# Patient Record
Sex: Female | Born: 1950 | Race: Black or African American | Hispanic: No | Marital: Married | Smoking: Never smoker
Health system: Southern US, Community
[De-identification: ages and names within clinical notes are randomized; demographics above are authoritative.]

## PROBLEM LIST (undated history)

## (undated) DIAGNOSIS — J45909 Unspecified asthma, uncomplicated: Secondary | ICD-10-CM

## (undated) HISTORY — PX: CHOLECYSTECTOMY: SHX55

---

## 1998-12-12 ENCOUNTER — Emergency Department (HOSPITAL_COMMUNITY): Admission: EM | Admit: 1998-12-12 | Discharge: 1998-12-12 | Payer: Self-pay | Admitting: *Deleted

## 1998-12-12 ENCOUNTER — Encounter: Payer: Self-pay | Admitting: Emergency Medicine

## 1999-10-07 ENCOUNTER — Encounter: Payer: Self-pay | Admitting: Gynecology

## 1999-10-07 ENCOUNTER — Encounter: Admission: RE | Admit: 1999-10-07 | Discharge: 1999-10-07 | Payer: Self-pay | Admitting: Gynecology

## 1999-10-08 ENCOUNTER — Other Ambulatory Visit: Admission: RE | Admit: 1999-10-08 | Discharge: 1999-10-08 | Payer: Self-pay | Admitting: Gynecology

## 1999-10-12 ENCOUNTER — Encounter: Payer: Self-pay | Admitting: Gynecology

## 1999-10-12 ENCOUNTER — Encounter: Admission: RE | Admit: 1999-10-12 | Discharge: 1999-10-12 | Payer: Self-pay | Admitting: Gynecology

## 2000-04-23 ENCOUNTER — Emergency Department (HOSPITAL_COMMUNITY): Admission: EM | Admit: 2000-04-23 | Discharge: 2000-04-23 | Payer: Self-pay | Admitting: Emergency Medicine

## 2000-06-19 ENCOUNTER — Emergency Department (HOSPITAL_COMMUNITY): Admission: EM | Admit: 2000-06-19 | Discharge: 2000-06-19 | Payer: Self-pay | Admitting: Emergency Medicine

## 2000-10-12 ENCOUNTER — Other Ambulatory Visit: Admission: RE | Admit: 2000-10-12 | Discharge: 2000-10-12 | Payer: Self-pay | Admitting: Gynecology

## 2001-10-02 ENCOUNTER — Encounter: Admission: RE | Admit: 2001-10-02 | Discharge: 2001-10-02 | Payer: Self-pay | Admitting: Gynecology

## 2001-10-02 ENCOUNTER — Encounter: Payer: Self-pay | Admitting: Gynecology

## 2001-12-25 ENCOUNTER — Other Ambulatory Visit: Admission: RE | Admit: 2001-12-25 | Discharge: 2001-12-25 | Payer: Self-pay | Admitting: Gynecology

## 2004-10-07 ENCOUNTER — Other Ambulatory Visit: Admission: RE | Admit: 2004-10-07 | Discharge: 2004-10-07 | Payer: Self-pay | Admitting: Gynecology

## 2011-07-06 ENCOUNTER — Other Ambulatory Visit: Payer: Self-pay | Admitting: Internal Medicine

## 2011-07-10 ENCOUNTER — Ambulatory Visit (INDEPENDENT_AMBULATORY_CARE_PROVIDER_SITE_OTHER): Payer: PRIVATE HEALTH INSURANCE | Admitting: Family Medicine

## 2011-07-10 VITALS — BP 174/70 | HR 109 | Temp 97.8°F | Resp 20 | Ht 61.75 in | Wt 214.2 lb

## 2011-07-10 DIAGNOSIS — I10 Essential (primary) hypertension: Secondary | ICD-10-CM

## 2011-07-10 DIAGNOSIS — J45909 Unspecified asthma, uncomplicated: Secondary | ICD-10-CM

## 2011-07-10 MED ORDER — AMLODIPINE BESYLATE 10 MG PO TABS: 10.0000 mg | ORAL_TABLET | Freq: Every day | ORAL | Status: DC

## 2011-07-10 MED ORDER — FLUTICASONE-SALMETEROL 100-50 MCG/DOSE IN AEPB: 1.0000 | INHALATION_SPRAY | Freq: Two times a day (BID) | RESPIRATORY_TRACT | Status: DC

## 2011-07-10 NOTE — Progress Notes (Signed)
  Subjective:    Patient ID: Lindsay Bowen, female    DOB: [birth year], 61 y.o.   MRN: [id number]  HPI 61 yo female with HTN and asthma here for medication refills.    Review of Systems     Objective:   Physical Exam        Assessment & Plan:

## 2011-07-11 LAB — BASIC METABOLIC PANEL
BUN: 15 mg/dL (ref 6–23)
CO2: 27 mEq/L (ref 19–32)
Calcium: 9.9 mg/dL (ref 8.4–10.5)
Chloride: 104 mEq/L (ref 96–112)
Creat: 1.06 mg/dL (ref 0.50–1.10)
Glucose, Bld: 101 mg/dL — ABNORMAL HIGH (ref 70–99)
Potassium: 4.2 mEq/L (ref 3.5–5.3)
Sodium: 140 mEq/L (ref 135–145)

## 2011-07-14 ENCOUNTER — Encounter: Payer: Self-pay | Admitting: Radiology

## 2011-12-01 ENCOUNTER — Telehealth: Payer: Self-pay

## 2011-12-06 ENCOUNTER — Telehealth: Payer: Self-pay

## 2011-12-06 NOTE — Telephone Encounter (Signed)
Received approval of prior auth done for pts Advair diskus. Faxed notification to pharmacy and called pt to notify.

## 2011-12-26 NOTE — Telephone Encounter (Signed)
DONE

## 2012-03-06 ENCOUNTER — Other Ambulatory Visit: Payer: Self-pay | Admitting: Family Medicine

## 2012-04-03 ENCOUNTER — Other Ambulatory Visit: Payer: Self-pay | Admitting: Family Medicine

## 2012-05-11 ENCOUNTER — Other Ambulatory Visit: Payer: Self-pay | Admitting: Physician Assistant

## 2013-01-01 ENCOUNTER — Other Ambulatory Visit: Payer: Self-pay | Admitting: Family Medicine

## 2019-06-02 ENCOUNTER — Other Ambulatory Visit: Payer: Self-pay

## 2019-06-02 ENCOUNTER — Ambulatory Visit: Payer: Medicare Other | Attending: Internal Medicine

## 2019-06-02 DIAGNOSIS — Z23 Encounter for immunization: Secondary | ICD-10-CM

## 2019-06-02 NOTE — Progress Notes (Signed)
   Covid-19 Vaccination Clinic  Name:  CORDY GOLL    MRN: [id number] DOB: 12/11/1950  06/02/2019  Ms. Dorriety was observed post Covid-19 immunization for 15 minutes without incidence. She was provided with Vaccine Information Sheet and instruction to access the V-Safe system.   Ms. Braz was instructed to call 911 with any severe reactions post vaccine: Marland Kitchen Difficulty breathing  . Swelling of your face and throat  . A fast heartbeat  . A bad rash all over your body  . Dizziness and weakness    Immunizations Administered    Name Date Dose VIS Date Route   Pfizer COVID-19 Vaccine 06/02/2019  3:57 PM 0.3 mL 03/15/2019 Intramuscular   Manufacturer: [place]   Lot: [id number]   [place]: [id number]

## 2019-06-04 ENCOUNTER — Other Ambulatory Visit: Payer: Self-pay | Admitting: Family Medicine

## 2019-06-04 DIAGNOSIS — Z1231 Encounter for screening mammogram for malignant neoplasm of breast: Secondary | ICD-10-CM

## 2019-06-06 ENCOUNTER — Other Ambulatory Visit: Payer: Self-pay | Admitting: Family Medicine

## 2019-06-06 DIAGNOSIS — E2839 Other primary ovarian failure: Secondary | ICD-10-CM

## 2019-07-02 ENCOUNTER — Ambulatory Visit: Payer: Medicare Other | Attending: Internal Medicine

## 2019-07-02 DIAGNOSIS — Z23 Encounter for immunization: Secondary | ICD-10-CM

## 2019-07-02 NOTE — Progress Notes (Signed)
   Covid-19 Vaccination Clinic  Name:  Lindsay Bowen    MRN: [id number] DOB: [birth year]  07/02/2019  [name] was observed post Covid-19 immunization for 15 minutes without incident. She was provided with Vaccine Information Sheet and instruction to access the V-Safe system.   [name] was instructed to call 911 with any severe reactions post vaccine: Marland Kitchen Difficulty breathing  . Swelling of face and throat  . A fast heartbeat  . A bad rash all over body  . Dizziness and weakness   Immunizations Administered    Name Date Dose VIS Date Route   Pfizer COVID-19 Vaccine 07/02/2019  2:18 PM 0.3 mL 03/15/2019 Intramuscular   Manufacturer: [place]   Lot: [id number]   [place]: [id number]

## 2019-09-10 ENCOUNTER — Other Ambulatory Visit: Payer: Medicare Other

## 2019-09-10 ENCOUNTER — Ambulatory Visit: Payer: Medicare Other

## 2020-04-22 DIAGNOSIS — I10 Essential (primary) hypertension: Secondary | ICD-10-CM | POA: Diagnosis not present

## 2020-04-22 DIAGNOSIS — J45909 Unspecified asthma, uncomplicated: Secondary | ICD-10-CM | POA: Diagnosis not present

## 2020-04-22 DIAGNOSIS — E7800 Pure hypercholesterolemia, unspecified: Secondary | ICD-10-CM | POA: Diagnosis not present

## 2020-06-30 DIAGNOSIS — E7800 Pure hypercholesterolemia, unspecified: Secondary | ICD-10-CM | POA: Diagnosis not present

## 2020-06-30 DIAGNOSIS — J45909 Unspecified asthma, uncomplicated: Secondary | ICD-10-CM | POA: Diagnosis not present

## 2020-06-30 DIAGNOSIS — I10 Essential (primary) hypertension: Secondary | ICD-10-CM | POA: Diagnosis not present

## 2020-08-06 DIAGNOSIS — J45909 Unspecified asthma, uncomplicated: Secondary | ICD-10-CM | POA: Diagnosis not present

## 2020-08-06 DIAGNOSIS — I10 Essential (primary) hypertension: Secondary | ICD-10-CM | POA: Diagnosis not present

## 2020-08-06 DIAGNOSIS — E7800 Pure hypercholesterolemia, unspecified: Secondary | ICD-10-CM | POA: Diagnosis not present

## 2020-09-21 DIAGNOSIS — I10 Essential (primary) hypertension: Secondary | ICD-10-CM | POA: Diagnosis not present

## 2020-09-21 DIAGNOSIS — L918 Other hypertrophic disorders of the skin: Secondary | ICD-10-CM | POA: Diagnosis not present

## 2020-09-21 DIAGNOSIS — Z0000 Encounter for general adult medical examination without abnormal findings: Secondary | ICD-10-CM | POA: Diagnosis not present

## 2020-09-24 ENCOUNTER — Other Ambulatory Visit: Payer: Self-pay | Admitting: Family Medicine

## 2020-09-24 DIAGNOSIS — E2839 Other primary ovarian failure: Secondary | ICD-10-CM

## 2020-09-24 DIAGNOSIS — Z1231 Encounter for screening mammogram for malignant neoplasm of breast: Secondary | ICD-10-CM

## 2020-09-30 DIAGNOSIS — I10 Essential (primary) hypertension: Secondary | ICD-10-CM | POA: Diagnosis not present

## 2020-09-30 DIAGNOSIS — E7800 Pure hypercholesterolemia, unspecified: Secondary | ICD-10-CM | POA: Diagnosis not present

## 2020-09-30 DIAGNOSIS — J45909 Unspecified asthma, uncomplicated: Secondary | ICD-10-CM | POA: Diagnosis not present

## 2020-10-01 DIAGNOSIS — J45909 Unspecified asthma, uncomplicated: Secondary | ICD-10-CM | POA: Diagnosis not present

## 2020-10-07 DIAGNOSIS — I10 Essential (primary) hypertension: Secondary | ICD-10-CM | POA: Diagnosis not present

## 2020-10-23 DIAGNOSIS — I10 Essential (primary) hypertension: Secondary | ICD-10-CM | POA: Diagnosis not present

## 2020-10-23 DIAGNOSIS — J45909 Unspecified asthma, uncomplicated: Secondary | ICD-10-CM | POA: Diagnosis not present

## 2020-10-23 DIAGNOSIS — E7800 Pure hypercholesterolemia, unspecified: Secondary | ICD-10-CM | POA: Diagnosis not present

## 2020-10-31 DIAGNOSIS — J45909 Unspecified asthma, uncomplicated: Secondary | ICD-10-CM | POA: Diagnosis not present

## 2020-11-25 DIAGNOSIS — E7800 Pure hypercholesterolemia, unspecified: Secondary | ICD-10-CM | POA: Diagnosis not present

## 2020-11-25 DIAGNOSIS — J45909 Unspecified asthma, uncomplicated: Secondary | ICD-10-CM | POA: Diagnosis not present

## 2020-11-25 DIAGNOSIS — I10 Essential (primary) hypertension: Secondary | ICD-10-CM | POA: Diagnosis not present

## 2020-12-01 DIAGNOSIS — J45909 Unspecified asthma, uncomplicated: Secondary | ICD-10-CM | POA: Diagnosis not present

## 2020-12-02 DIAGNOSIS — R634 Abnormal weight loss: Secondary | ICD-10-CM | POA: Diagnosis not present

## 2020-12-11 ENCOUNTER — Inpatient Hospital Stay (HOSPITAL_COMMUNITY)
Admission: EM | Admit: 2020-12-11 | Discharge: 2020-12-15 | DRG: 683 | Disposition: A | Discharge status: Home / Self Care | Payer: Medicare Other | Attending: Internal Medicine | Admitting: Internal Medicine

## 2020-12-11 ENCOUNTER — Other Ambulatory Visit: Payer: Self-pay

## 2020-12-11 ENCOUNTER — Inpatient Hospital Stay (HOSPITAL_COMMUNITY): Payer: Medicare Other

## 2020-12-11 ENCOUNTER — Encounter (HOSPITAL_COMMUNITY): Payer: Self-pay

## 2020-12-11 DIAGNOSIS — Z1211 Encounter for screening for malignant neoplasm of colon: Secondary | ICD-10-CM | POA: Diagnosis not present

## 2020-12-11 DIAGNOSIS — R7989 Other specified abnormal findings of blood chemistry: Secondary | ICD-10-CM | POA: Diagnosis not present

## 2020-12-11 DIAGNOSIS — N179 Acute kidney failure, unspecified: Secondary | ICD-10-CM | POA: Diagnosis not present

## 2020-12-11 DIAGNOSIS — Z7951 Long term (current) use of inhaled steroids: Secondary | ICD-10-CM

## 2020-12-11 DIAGNOSIS — Z6828 Body mass index (BMI) 28.0-28.9, adult: Secondary | ICD-10-CM | POA: Diagnosis not present

## 2020-12-11 DIAGNOSIS — Z634 Disappearance and death of family member: Secondary | ICD-10-CM | POA: Diagnosis not present

## 2020-12-11 DIAGNOSIS — R17 Unspecified jaundice: Secondary | ICD-10-CM | POA: Diagnosis present

## 2020-12-11 DIAGNOSIS — E876 Hypokalemia: Secondary | ICD-10-CM | POA: Diagnosis not present

## 2020-12-11 DIAGNOSIS — R140 Abdominal distension (gaseous): Secondary | ICD-10-CM

## 2020-12-11 DIAGNOSIS — E782 Mixed hyperlipidemia: Secondary | ICD-10-CM | POA: Diagnosis present

## 2020-12-11 DIAGNOSIS — K5730 Diverticulosis of large intestine without perforation or abscess without bleeding: Secondary | ICD-10-CM | POA: Diagnosis not present

## 2020-12-11 DIAGNOSIS — J9811 Atelectasis: Secondary | ICD-10-CM | POA: Diagnosis not present

## 2020-12-11 DIAGNOSIS — F329 Major depressive disorder, single episode, unspecified: Principal | ICD-10-CM | POA: Diagnosis present

## 2020-12-11 DIAGNOSIS — Z79899 Other long term (current) drug therapy: Secondary | ICD-10-CM | POA: Diagnosis not present

## 2020-12-11 DIAGNOSIS — Z91048 Other nonmedicinal substance allergy status: Secondary | ICD-10-CM

## 2020-12-11 DIAGNOSIS — N289 Disorder of kidney and ureter, unspecified: Secondary | ICD-10-CM | POA: Diagnosis not present

## 2020-12-11 DIAGNOSIS — M47814 Spondylosis without myelopathy or radiculopathy, thoracic region: Secondary | ICD-10-CM | POA: Diagnosis not present

## 2020-12-11 DIAGNOSIS — J189 Pneumonia, unspecified organism: Secondary | ICD-10-CM

## 2020-12-11 DIAGNOSIS — I10 Essential (primary) hypertension: Secondary | ICD-10-CM | POA: Diagnosis present

## 2020-12-11 DIAGNOSIS — R634 Abnormal weight loss: Secondary | ICD-10-CM | POA: Diagnosis not present

## 2020-12-11 DIAGNOSIS — J4520 Mild intermittent asthma, uncomplicated: Secondary | ICD-10-CM | POA: Diagnosis present

## 2020-12-11 DIAGNOSIS — J45909 Unspecified asthma, uncomplicated: Secondary | ICD-10-CM | POA: Diagnosis not present

## 2020-12-11 DIAGNOSIS — R911 Solitary pulmonary nodule: Secondary | ICD-10-CM | POA: Diagnosis not present

## 2020-12-11 DIAGNOSIS — Z20822 Contact with and (suspected) exposure to covid-19: Secondary | ICD-10-CM | POA: Diagnosis present

## 2020-12-11 DIAGNOSIS — N19 Unspecified kidney failure: Secondary | ICD-10-CM

## 2020-12-11 DIAGNOSIS — R945 Abnormal results of liver function studies: Secondary | ICD-10-CM

## 2020-12-11 DIAGNOSIS — R9431 Abnormal electrocardiogram [ECG] [EKG]: Secondary | ICD-10-CM | POA: Diagnosis not present

## 2020-12-11 DIAGNOSIS — R7401 Elevation of levels of liver transaminase levels: Secondary | ICD-10-CM | POA: Diagnosis not present

## 2020-12-11 DIAGNOSIS — D72829 Elevated white blood cell count, unspecified: Secondary | ICD-10-CM | POA: Diagnosis not present

## 2020-12-11 HISTORY — DX: Unspecified asthma, uncomplicated: J45.909

## 2020-12-11 LAB — COMPREHENSIVE METABOLIC PANEL
ALT: 12 U/L (ref 0–44)
AST: 16 U/L (ref 15–41)
Albumin: 4.9 g/dL (ref 3.5–5.0)
Alkaline Phosphatase: 52 U/L (ref 38–126)
Anion gap: 15 (ref 5–15)
BUN: 57 mg/dL — ABNORMAL HIGH (ref 8–23)
CO2: 24 mmol/L (ref 22–32)
Calcium: 10 mg/dL (ref 8.9–10.3)
Chloride: 101 mmol/L (ref 98–111)
Creatinine, Ser: 4.65 mg/dL — ABNORMAL HIGH (ref 0.44–1.00)
GFR, Estimated: 10 mL/min — ABNORMAL LOW (ref >60)
Glucose, Bld: 118 mg/dL — ABNORMAL HIGH (ref 70–99)
Potassium: 3.3 mmol/L — ABNORMAL LOW (ref 3.5–5.1)
Sodium: 140 mmol/L (ref 135–145)
Total Bilirubin: 2.5 mg/dL — ABNORMAL HIGH (ref 0.3–1.2)
Total Protein: 8.3 g/dL — ABNORMAL HIGH (ref 6.5–8.1)

## 2020-12-11 LAB — CBC
HCT: 40.8 % (ref 36.0–46.0)
Hemoglobin: 13.9 g/dL (ref 12.0–15.0)
MCH: 29.2 pg (ref 26.0–34.0)
MCHC: 34.1 g/dL (ref 30.0–36.0)
MCV: 85.7 fL (ref 80.0–100.0)
Platelets: 276 10*3/uL (ref 150–400)
RBC: 4.76 MIL/uL (ref 3.87–5.11)
RDW: 13.7 % (ref 11.5–15.5)
WBC: 14.5 10*3/uL — ABNORMAL HIGH (ref 4.0–10.5)
nRBC: 0 % (ref 0.0–0.2)

## 2020-12-11 LAB — RESP PANEL BY RT-PCR (FLU A&B, COVID) ARPGX2
Influenza A by PCR: NEGATIVE
Influenza B by PCR: NEGATIVE
SARS Coronavirus 2 by RT PCR: NEGATIVE

## 2020-12-11 MED ORDER — LACTATED RINGERS IV SOLN: INTRAVENOUS | Status: DC

## 2020-12-11 NOTE — ED Triage Notes (Signed)
Pt came in with c/o abnormal labs. Pt states "it is something with my kidneys". States she had blood drawn at [facility] today and they called her with an abnormal result. She was not sent here, but she elected to come

## 2020-12-11 NOTE — ED Provider Notes (Signed)
[place] DEPT Provider Note   CSN: [id number] Arrival date & time: 12/11/20  1935     History Chief Complaint  Patient presents with   abnormal labs    ARLY BLEE is a 70 y.o. female.  70 year old female presents after visiting her doctor today was called this evening due to elevated creatinine.  States that she has no prior history of chronic kidney disease.  States that she has been in her baseline state of health.  No new medications.  Denies any shortness of breath.  Patient came to the hospital due to concern for this.      Past Medical History:  Diagnosis Date   Asthma     There are no problems to display for this patient.      OB History   No obstetric history on file.     No family history on file.  Social History   Tobacco Use   Smoking status: Never  Substance Use Topics   Alcohol use: No   Drug use: No    Home Medications Prior to Admission medications   Medication Sig Start Date End Date Taking? Authorizing Provider  amLODipine (NORVASC) 10 MG tablet Take 0.5 tablets (5 mg total) by mouth 2 (two) times daily. Need office visit for additional refills. 04/03/12   Harrison Mons, PA  Fluticasone-Salmeterol (ADVAIR DISKUS) 100-50 MCG/DOSE AEPB Inhale 1 puff into the lungs 2 (two) times daily. Need office visit for additional refills. 04/03/12   Harrison Mons, PA    Allergies    Red dye  Review of Systems   Review of Systems  All other systems reviewed and are negative.  Physical Exam Updated Vital Signs BP 111/68   Pulse 96   Temp 97.9 F (36.6 C) (Oral)   Resp 17   Wt 68.9 kg   SpO2 100%   BMI 28.03 kg/m   Physical Exam Vitals and nursing note reviewed.  Constitutional:      General: She is not in acute distress.    Appearance: Normal appearance. She is well-developed. She is not toxic-appearing.  HENT:     Head: Normocephalic and atraumatic.  Eyes:     General: Lids are normal.      Conjunctiva/sclera: Conjunctivae normal.     Pupils: Pupils are equal, round, and reactive to light.  Neck:     Thyroid: No thyroid mass.     Trachea: No tracheal deviation.  Cardiovascular:     Rate and Rhythm: Normal rate and regular rhythm.     Heart sounds: Normal heart sounds. No murmur heard.   No gallop.  Pulmonary:     Effort: Pulmonary effort is normal. No respiratory distress.     Breath sounds: Normal breath sounds. No stridor. No decreased breath sounds, wheezing, rhonchi or rales.  Abdominal:     General: There is no distension.     Palpations: Abdomen is soft.     Tenderness: There is no abdominal tenderness. There is no rebound.  Musculoskeletal:        General: No tenderness. Normal range of motion.     Cervical back: Normal range of motion and neck supple.  Skin:    General: Skin is warm and dry.     Findings: No abrasion or rash.  Neurological:     Mental Status: She is alert and oriented to person, place, and time. Mental status is at baseline.     GCS: GCS eye subscore is 4. GCS verbal  subscore is 5. GCS motor subscore is 6.     Cranial Nerves: Cranial nerves are intact. No cranial nerve deficit.     Sensory: No sensory deficit.     Motor: Motor function is intact.  Psychiatric:        Attention and Perception: Attention normal.        Speech: Speech normal.        Behavior: Behavior normal.    ED Results / Procedures / Treatments   Labs (all labs ordered are listed, but only abnormal results are displayed) Labs Reviewed  CBC - Abnormal; Notable for the following components:      Result Value   WBC 14.5 (*)    All other components within normal limits  COMPREHENSIVE METABOLIC PANEL - Abnormal; Notable for the following components:   Potassium 3.3 (*)    Glucose, Bld 118 (*)    BUN 57 (*)    Creatinine, Ser 4.65 (*)    Total Protein 8.3 (*)    Total Bilirubin 2.5 (*)    GFR, Estimated 10 (*)    All other components within normal limits  RESP PANEL  BY RT-PCR (FLU A&B, COVID) [id number]    EKG None  Radiology No results found.  Procedures Procedures   Medications Ordered in ED Medications  lactated ringers infusion ( Intravenous New Bag/Given 12/11/20 2234)    ED Course  I have reviewed the triage vital signs and the nursing notes.  Pertinent labs & imaging results that were available during my care of the patient were reviewed by me and considered in my medical decision making (see chart for details).    MDM Rules/Calculators/A&P                          Patient has a slightly low potassium here. Discussed with Dr. Carolin Sicks, nephrologist on-call, recommends hospitalist admission for evaluation of elevated creatinine Final Clinical Impression(s) / ED Diagnoses Final diagnoses:  None    Rx / DC Orders ED Discharge Orders     None        Lacretia Leigh, MD 12/11/20 2240

## 2020-12-11 NOTE — ED Provider Notes (Signed)
Emergency Medicine Provider Triage Evaluation Note  Lindsay Bowen , a 70 y.o. female  was evaluated in triage.  Pt complains of "abnormal labs."  Patient states she received a call earlier today regarding lab work that she had done at some point in the 1 to 2 weeks.  She believes she had blood work done at [facility] for regular screening labs.  She was called today with elevated kidney function.  She was not advised to come to the ED however was brought by family with concern.  Patient states she feels fine.  She has no complaints at this time.  She does seem like she does not know too much about her own medical history.  Family not currently in triage to gain additional information at this time.  Review of Systems  Positive: No complaints Negative: No complaints  Physical Exam  BP 129/64 (BP Location: Left Arm)   Pulse (!) 104   Temp 97.9 F (36.6 C) (Oral)   Resp 18   Wt 68.9 kg   SpO2 98%   BMI 28.03 kg/m  Gen:   Awake, no distress   Resp:  Normal effort  MSK:   Moves extremities without difficulty  Other:    Medical Decision Making  Medically screening exam initiated at 8:30 PM.  Appropriate orders placed.  Noah Delaine was informed that the remainder of the evaluation will be completed by another provider, this initial triage assessment does not replace that evaluation, and the importance of remaining in the ED until their evaluation is complete.     Eustaquio Maize, PA-C 12/11/20 2031    Lacretia Leigh, MD 12/15/20 1318

## 2020-12-12 ENCOUNTER — Encounter (HOSPITAL_COMMUNITY): Payer: Self-pay | Admitting: Internal Medicine

## 2020-12-12 ENCOUNTER — Inpatient Hospital Stay (HOSPITAL_COMMUNITY): Payer: Medicare Other

## 2020-12-12 DIAGNOSIS — F329 Major depressive disorder, single episode, unspecified: Secondary | ICD-10-CM | POA: Diagnosis present

## 2020-12-12 DIAGNOSIS — I10 Essential (primary) hypertension: Secondary | ICD-10-CM

## 2020-12-12 DIAGNOSIS — D72829 Elevated white blood cell count, unspecified: Secondary | ICD-10-CM | POA: Diagnosis present

## 2020-12-12 DIAGNOSIS — J4520 Mild intermittent asthma, uncomplicated: Secondary | ICD-10-CM

## 2020-12-12 DIAGNOSIS — N179 Acute kidney failure, unspecified: Principal | ICD-10-CM

## 2020-12-12 DIAGNOSIS — E876 Hypokalemia: Secondary | ICD-10-CM

## 2020-12-12 DIAGNOSIS — E782 Mixed hyperlipidemia: Secondary | ICD-10-CM

## 2020-12-12 LAB — HIV ANTIBODY (ROUTINE TESTING W REFLEX): HIV Screen 4th Generation wRfx: NONREACTIVE

## 2020-12-12 LAB — COMPREHENSIVE METABOLIC PANEL
ALT: 10 U/L (ref 0–44)
AST: 15 U/L (ref 15–41)
Albumin: 4.1 g/dL (ref 3.5–5.0)
Alkaline Phosphatase: 43 U/L (ref 38–126)
Anion gap: 13 (ref 5–15)
BUN: 54 mg/dL — ABNORMAL HIGH (ref 8–23)
CO2: 27 mmol/L (ref 22–32)
Calcium: 9.5 mg/dL (ref 8.9–10.3)
Chloride: 101 mmol/L (ref 98–111)
Creatinine, Ser: 4 mg/dL — ABNORMAL HIGH (ref 0.44–1.00)
GFR, Estimated: 12 mL/min — ABNORMAL LOW (ref >60)
Glucose, Bld: 78 mg/dL (ref 70–99)
Potassium: 3.4 mmol/L — ABNORMAL LOW (ref 3.5–5.1)
Sodium: 141 mmol/L (ref 135–145)
Total Bilirubin: 1.9 mg/dL — ABNORMAL HIGH (ref 0.3–1.2)
Total Protein: 7.1 g/dL (ref 6.5–8.1)

## 2020-12-12 LAB — CREATININE, URINE, RANDOM: Creatinine, Urine: 307.47 mg/dL

## 2020-12-12 LAB — CBC WITH DIFFERENTIAL/PLATELET
Abs Immature Granulocytes: 0.08 10*3/uL — ABNORMAL HIGH (ref 0.00–0.07)
Basophils Absolute: 0 10*3/uL (ref 0.0–0.1)
Basophils Relative: 0 %
Eosinophils Absolute: 0.1 10*3/uL (ref 0.0–0.5)
Eosinophils Relative: 0 %
HCT: 35.7 % — ABNORMAL LOW (ref 36.0–46.0)
Hemoglobin: 11.9 g/dL — ABNORMAL LOW (ref 12.0–15.0)
Immature Granulocytes: 1 %
Lymphocytes Relative: 17 %
Lymphs Abs: 2.3 10*3/uL (ref 0.7–4.0)
MCH: 28.9 pg (ref 26.0–34.0)
MCHC: 33.3 g/dL (ref 30.0–36.0)
MCV: 86.7 fL (ref 80.0–100.0)
Monocytes Absolute: 1 10*3/uL (ref 0.1–1.0)
Monocytes Relative: 7 %
Neutro Abs: 9.6 10*3/uL — ABNORMAL HIGH (ref 1.7–7.7)
Neutrophils Relative %: 75 %
Platelets: 215 10*3/uL (ref 150–400)
RBC: 4.12 MIL/uL (ref 3.87–5.11)
RDW: 13.6 % (ref 11.5–15.5)
WBC: 13 10*3/uL — ABNORMAL HIGH (ref 4.0–10.5)
nRBC: 0 % (ref 0.0–0.2)

## 2020-12-12 LAB — URINALYSIS, COMPLETE (UACMP) WITH MICROSCOPIC
Glucose, UA: NEGATIVE mg/dL
Hgb urine dipstick: NEGATIVE
Leukocytes,Ua: NEGATIVE
Nitrite: NEGATIVE
Protein, ur: NEGATIVE mg/dL
Specific Gravity, Urine: 1.015 (ref 1.005–1.030)
pH: 6 (ref 5.0–8.0)

## 2020-12-12 LAB — BASIC METABOLIC PANEL
Anion gap: 13 (ref 5–15)
BUN: 49 mg/dL — ABNORMAL HIGH (ref 8–23)
CO2: 23 mmol/L (ref 22–32)
Calcium: 9.6 mg/dL (ref 8.9–10.3)
Chloride: 107 mmol/L (ref 98–111)
Creatinine, Ser: 3.03 mg/dL — ABNORMAL HIGH (ref 0.44–1.00)
GFR, Estimated: 16 mL/min — ABNORMAL LOW (ref >60)
Glucose, Bld: 87 mg/dL (ref 70–99)
Potassium: 3.9 mmol/L (ref 3.5–5.1)
Sodium: 143 mmol/L (ref 135–145)

## 2020-12-12 LAB — PROCALCITONIN: Procalcitonin: 0.1 ng/mL

## 2020-12-12 LAB — C-REACTIVE PROTEIN: CRP: 0.5 mg/dL (ref <1.0)

## 2020-12-12 LAB — MAGNESIUM: Magnesium: 2.5 mg/dL — ABNORMAL HIGH (ref 1.7–2.4)

## 2020-12-12 LAB — SODIUM, URINE, RANDOM: Sodium, Ur: 21 mmol/L

## 2020-12-12 LAB — CK: Total CK: 88 U/L (ref 38–234)

## 2020-12-12 MED ORDER — ESCITALOPRAM OXALATE 10 MG PO TABS
10.0000 mg | ORAL_TABLET | Freq: Every day | ORAL | Status: DC
  Administered 2020-12-12 – 2020-12-15 (×4): 10 mg via ORAL
  Filled 2020-12-12 (×4): qty 1

## 2020-12-12 MED ORDER — FLUTICASONE FUROATE-VILANTEROL 100-25 MCG/INH IN AEPB
1.0000 | INHALATION_SPRAY | Freq: Every day | RESPIRATORY_TRACT | Status: DC
  Administered 2020-12-12 – 2020-12-15 (×4): 1 via RESPIRATORY_TRACT
  Filled 2020-12-12 (×2): qty 28

## 2020-12-12 MED ORDER — MELATONIN 5 MG PO TABS: 10.0000 mg | ORAL_TABLET | Freq: Every evening | ORAL | Status: DC | PRN

## 2020-12-12 MED ORDER — ATORVASTATIN CALCIUM 40 MG PO TABS
40.0000 mg | ORAL_TABLET | Freq: Every day | ORAL | Status: DC
  Administered 2020-12-12 – 2020-12-15 (×4): 40 mg via ORAL
  Filled 2020-12-12 (×4): qty 1

## 2020-12-12 MED ORDER — POTASSIUM CHLORIDE CRYS ER 20 MEQ PO TBCR
20.0000 meq | EXTENDED_RELEASE_TABLET | Freq: Once | ORAL | Status: AC
  Administered 2020-12-12: 20 meq via ORAL
  Filled 2020-12-12: qty 1

## 2020-12-12 MED ORDER — ACETAMINOPHEN 325 MG PO TABS: 650.0000 mg | ORAL_TABLET | Freq: Four times a day (QID) | ORAL | Status: DC | PRN

## 2020-12-12 MED ORDER — AMLODIPINE BESYLATE 5 MG PO TABS
5.0000 mg | ORAL_TABLET | Freq: Two times a day (BID) | ORAL | Status: DC
  Administered 2020-12-12 – 2020-12-14 (×4): 5 mg via ORAL
  Filled 2020-12-12 (×5): qty 1

## 2020-12-12 MED ORDER — ONDANSETRON HCL 4 MG PO TABS: 4.0000 mg | ORAL_TABLET | Freq: Four times a day (QID) | ORAL | Status: DC | PRN

## 2020-12-12 MED ORDER — HEPARIN SODIUM (PORCINE) 5000 UNIT/ML IJ SOLN
5000.0000 [IU] | Freq: Three times a day (TID) | INTRAMUSCULAR | Status: DC
  Administered 2020-12-12 – 2020-12-15 (×10): 5000 [IU] via SUBCUTANEOUS
  Filled 2020-12-12 (×10): qty 1

## 2020-12-12 MED ORDER — ONDANSETRON HCL 4 MG/2ML IJ SOLN
4.0000 mg | Freq: Four times a day (QID) | INTRAMUSCULAR | Status: DC | PRN
  Administered 2020-12-13: 4 mg via INTRAVENOUS
  Filled 2020-12-12: qty 2

## 2020-12-12 MED ORDER — POLYETHYLENE GLYCOL 3350 17 G PO PACK: 17.0000 g | PACK | Freq: Every day | ORAL | Status: DC | PRN

## 2020-12-12 MED ORDER — LACTATED RINGERS IV SOLN: INTRAVENOUS | Status: AC

## 2020-12-12 MED ORDER — ACETAMINOPHEN 650 MG RE SUPP: 650.0000 mg | Freq: Four times a day (QID) | RECTAL | Status: DC | PRN

## 2020-12-12 MED ORDER — HYDRALAZINE HCL 20 MG/ML IJ SOLN: 10.0000 mg | Freq: Four times a day (QID) | INTRAMUSCULAR | Status: DC | PRN

## 2020-12-12 NOTE — ED Notes (Signed)
Patient ambulatory to restroom with standby assistance.

## 2020-12-12 NOTE — Progress Notes (Signed)
Patient admitted this morning by my partner with new onset AKI of unclear etiology though likely secondary to decreased p.o. intake losartan and diuresis at home. Creatinine improving with IV hydration. Renal ultrasound no acute findings no hydronephrosis.

## 2020-12-12 NOTE — H&P (Signed)
History and Physical    Lindsay Bowen [id number] DOB: [birth year] DOA: 12/11/2020  PCP: Kathyrn Lass, MD  Patient coming from: Home   Chief Complaint:  Chief Complaint  Patient presents with   abnormal labs     HPI:    70 year old female with past medical history of asthma, hyperlipidemia, hypertension who presents to [facility] emergency department after being instructed by her primary care provider that she has "abnormal labs."    Patient explains that she had blood work with [facility] within the past 1 to 2 weeks.  She states that these were routine labs and that she has not experienced any new symptoms as of late.  Patient denies shortness of breath, fevers, abdominal pain, dysuria, diarrhea, changes in appetite, recent travel, sick contacts or contact with confirmed COVID-19 infection.  Patient denies regular NSAID use or illicit drug use.  Upon further questioning however patient does admit to a 1 year history of severe unrelenting depression since the death of her husband last 01-01-2023.  This is led to extremely poor oral intake as the patient eats and drinks very little.  Patient reports at least a 40 pound weight loss over the span of time as a result.  Patient was contacted by her primary care provider office and instructed to come in for evaluation.  Patient then presented to [facility] emergency department for evaluation.  Upon evaluation in the emergency department, chemistry did confirm the patient has a markedly elevated creatinine of 4.65 with elevated BUN of 57.  Patient was initiated on intravenous hydration.  Case was discussed with Dr. Carolin Sicks with nephrology who recommended hospitalization for work-up of patient's surprisingly substantial renal injury.  The hospitalist group was then called to assess the patient for admission to the hospital.  Review of Systems:   Review of Systems  Constitutional:  Positive for malaise/fatigue and  weight loss.  Psychiatric/Behavioral:  Positive for depression.   All other systems reviewed and are negative.  Past Medical History:  Diagnosis Date   Asthma     Past Surgical History:  Procedure Laterality Date   CHOLECYSTECTOMY       reports that she has never smoked. She has never used smokeless tobacco. She reports that she does not drink alcohol and does not use drugs.  Allergies  Allergen Reactions   Red Dye Itching    Family History  Problem Relation Age of Onset   Kidney disease Neg Hx      Prior to Admission medications   Medication Sig Start Date End Date Taking? Authorizing Provider  amLODipine (NORVASC) 10 MG tablet Take 0.5 tablets (5 mg total) by mouth 2 (two) times daily. Need office visit for additional refills. 04/03/12  Yes Jeffery, Chelle, PA  atorvastatin (LIPITOR) 40 MG tablet Take 40 mg by mouth daily. 12/09/20  Yes [provider]  Fluticasone-Salmeterol (ADVAIR DISKUS) 100-50 MCG/DOSE AEPB Inhale 1 puff into the lungs 2 (two) times daily. Need office visit for additional refills. 04/03/12  Yes Jeffery, Chelle, PA  hydrochlorothiazide (HYDRODIURIL) 25 MG tablet Take 25 mg by mouth every morning. 09/30/20  Yes [provider]  losartan (COZAAR) 100 MG tablet Take 100 mg by mouth daily. 09/30/20  Yes [provider]    Physical Exam: Vitals:   12/12/20 0200 12/12/20 0401 12/12/20 0630 12/12/20 0700  BP: (!) 112/59 103/63 120/65 120/66  Pulse: 78 78 85 84  Resp:  16  16  Temp:  TempSrc:      SpO2: 96% 96% 97% 97%  Weight:        Constitutional: Awake alert and oriented x3, no associated distress.   Skin: no rashes, no lesions, poor skin turgor noted. Eyes: Pupils are equally reactive to light.  No evidence of scleral icterus or conjunctival pallor.  ENMT: Dry mucous membranes noted.  Posterior pharynx clear of any exudate or lesions.   Neck: normal, supple, no masses, no thyromegaly.  No evidence of jugular venous  distension.   Respiratory: clear to auscultation bilaterally, no wheezing, no crackles. Normal respiratory effort. No accessory muscle use.  Cardiovascular: Regular rate and rhythm, no murmurs / rubs / gallops. No extremity edema. 2+ pedal pulses. No carotid bruits.  Chest:   Nontender without crepitus or deformity.   Back:   Nontender without crepitus or deformity. Abdomen: Vague firm movable intra-abdominal fullness palpated in the epigastric and left upper quadrant areas.  Abdomen is nontender and soft.    Positive bowel sounds noted in all quadrants.   Musculoskeletal: No joint deformity upper and lower extremities. Good ROM, no contractures. Normal muscle tone.  Neurologic: CN 2-12 grossly intact. Sensation intact.  Patient moving all 4 extremities spontaneously.  Patient is following all commands.  Patient is responsive to verbal stimuli.   Psychiatric: Patient exhibits extremely depressed mood with flat affect.  Patient denies suicidal ideation.  Patient seems to possess insight as to their current situation.     Labs on Admission: I have personally reviewed following labs and imaging studies -   CBC: Recent Labs  Lab 12/11/20 2024 12/12/20 0437  WBC 14.5* 13.0*  NEUTROABS  --  9.6*  HGB 13.9 11.9*  HCT 40.8 35.7*  MCV 85.7 86.7  PLT 276 [id number]   Basic Metabolic Panel: Recent Labs  Lab 12/11/20 2024 12/12/20 0437  NA 140 141  K 3.3* 3.4*  CL 101 101  CO2 24 27  GLUCOSE 118* 78  BUN 57* 54*  CREATININE 4.65* 4.00*  CALCIUM 10.0 9.5  MG  --  2.5*   GFR: CrCl cannot be calculated (Unknown ideal weight.). Liver Function Tests: Recent Labs  Lab 12/11/20 2024 12/12/20 0437  AST 16 15  ALT 12 10  ALKPHOS 52 43  BILITOT 2.5* 1.9*  PROT 8.3* 7.1  ALBUMIN 4.9 4.1   No results for input(s): LIPASE, AMYLASE in the last 168 hours. No results for input(s): AMMONIA in the last 168 hours. Coagulation Profile: No results for input(s): INR, PROTIME in the last 168  hours. Cardiac Enzymes: Recent Labs  Lab 12/12/20 0437  CKTOTAL 88   BNP (last 3 results) No results for input(s): PROBNP in the last 8760 hours. HbA1C: No results for input(s): HGBA1C in the last 72 hours. CBG: No results for input(s): GLUCAP in the last 168 hours. Lipid Profile: No results for input(s): CHOL, HDL, LDLCALC, TRIG, CHOLHDL, LDLDIRECT in the last 72 hours. Thyroid Function Tests: No results for input(s): TSH, T4TOTAL, FREET4, T3FREE, THYROIDAB in the last 72 hours. Anemia Panel: No results for input(s): VITAMINB12, FOLATE, FERRITIN, TIBC, IRON, RETICCTPCT in the last 72 hours. Urine analysis:    Component Value Date/Time   COLORURINE YELLOW (A) 12/12/2020 0026   APPEARANCEUR HAZY (A) 12/12/2020 0026   LABSPEC 1.015 12/12/2020 0026   PHURINE 6.0 12/12/2020 0026   GLUCOSEU NEGATIVE 12/12/2020 0026   HGBUR NEGATIVE 12/12/2020 0026   BILIRUBINUR SMALL (A) 12/12/2020 0026   KETONESUR TRACE (A) 12/12/2020 0026   PROTEINUR NEGATIVE  12/12/2020 0026   NITRITE NEGATIVE 12/12/2020 0026   LEUKOCYTESUR NEGATIVE 12/12/2020 0026    Radiological Exams on Admission - Personally Reviewed: DG Chest 1 View  Result Date: 12/12/2020 CLINICAL DATA:  History of asthma. EXAM: CHEST  1 VIEW COMPARISON:  None. FINDINGS: Mild diffuse chronic interstitial coarsening. No focal consolidation, pleural effusion, or pneumothorax. The cardiac silhouette is within normal limits. Acute osseous pathology. Degenerative changes of the spine. IMPRESSION: No active disease. Electronically Signed   By: Anner Crete M.D.   On: 12/12/2020 03:02   US RENAL  Result Date: 12/11/2020 CLINICAL DATA:  Renal failure EXAM: RENAL / URINARY TRACT ULTRASOUND COMPLETE COMPARISON:  None. FINDINGS: Right Kidney: Renal measurements: 9.6 x 4.4 x 4.2 cm = volume: 92.4 mL. Cortex is slightly echogenic. No mass or hydronephrosis. Left Kidney: Renal measurements: 9.6 x 4.7 x 5 cm = volume: 116.8 mL. Cortex is slightly  echogenic. No mass or hydronephrosis. Bladder: Appears normal for degree of bladder distention. Other: None. IMPRESSION: Slightly echogenic kidneys consistent with medical renal disease. No hydronephrosis Electronically Signed   By: Donavan Foil M.D.   On: 12/11/2020 23:43   US Abdomen Limited RUQ (LIVER/GB)  Result Date: 12/12/2020 CLINICAL DATA:  Elevated liver function tests EXAM: ULTRASOUND ABDOMEN LIMITED RIGHT UPPER QUADRANT COMPARISON:  None. FINDINGS: Gallbladder: Absent Common bile duct: Diameter: 6 mm in proximal diameter Liver: No focal lesion identified. Within normal limits in parenchymal echogenicity. Portal vein is patent on color Doppler imaging with normal direction of blood flow towards the liver. Other: None. IMPRESSION: Status post cholecystectomy. Normal examination of the liver. Electronically Signed   By: Fidela Salisbury M.D.   On: 12/12/2020 02:37      Assessment/Plan Principal Problem:   Acute kidney injury [facility])  Patient presenting with substantial acute kidney injury and has been directed to come to the [facility] emergency department by her primary care provider Etiology is currently unclear although this could possibly be prerenal in origin due to volume depletion due to longstanding extremely poor oral intake in the setting of depression Hydrating patient with intravenous isotonic fluids Obtaining urine electrolytes Obtaining renal ultrasound Obtaining bladder scan Strict input and output monitoring Monitoring renal function and electrolytes with serial chemistries Minimizing nephrotoxic agents as much as possible.  Active Problems:   Essential hypertension  Continue home regimen of amlodipine Holding home regimen of losartan and hydrochlorothiazide As needed intravenous hydralazine for markedly elevated blood pressure.    Mixed hyperlipidemia  Continue home regimen of Lipitor    Mild intermittent asthma, uncomplicated  No evidence of asthma  exacerbation at this time    Hypokalemia  Cautious replacement with small amounts of potassium considering acute kidney injury    Hyperbilirubinemia  Mild hyperbilirubinemia noted on initial chemistries Considering vague presentation and significant unintentional weight loss obtaining right upper quadrant ultrasound    Leukocytosis  No clinical evidence of infection Obtain urinalysis and chest x-ray No recent steroid use Patient does not appear to be in significant distress and therefore I do not believe that this is stress-induced. If persists patient may need outpatient hematology follow-up    Major depressive disorder  Persisting substantial depression nearly 1 year after the death of her husband in Jan 06, 2020 This is leading to extremely poor oral intake depressed mood and decreased energy Will initiate Lexapro Patient should follow-up with behavioral health at time of discharge.   Code Status:  Full code  code status decision has been confirmed with: patient Family Communication:  deferred   Status is: Inpatient  Remains inpatient appropriate because:Ongoing diagnostic testing needed not appropriate for outpatient work up, IV treatments appropriate due to intensity of illness or inability to take PO, and Inpatient level of care appropriate due to severity of illness  Dispo: The patient is from: Home              Anticipated d/c is to: Home              Patient currently is not medically stable to d/c.   Difficult to place patient No        Vernelle Emerald MD [facility] Pager [phone]  If 7PM-7AM, please contact night-coverage [email or web address] Use universal [place] password for that web site. If you do not have the password, please call the hospital operator.  12/12/2020, 9:16 AM

## 2020-12-13 LAB — CBC
HCT: 35.8 % — ABNORMAL LOW (ref 36.0–46.0)
Hemoglobin: 12.3 g/dL (ref 12.0–15.0)
MCH: 29.1 pg (ref 26.0–34.0)
MCHC: 34.4 g/dL (ref 30.0–36.0)
MCV: 84.8 fL (ref 80.0–100.0)
Platelets: 230 10*3/uL (ref 150–400)
RBC: 4.22 MIL/uL (ref 3.87–5.11)
RDW: 13.5 % (ref 11.5–15.5)
WBC: 10.7 10*3/uL — ABNORMAL HIGH (ref 4.0–10.5)
nRBC: 0 % (ref 0.0–0.2)

## 2020-12-13 LAB — BASIC METABOLIC PANEL
Anion gap: 11 (ref 5–15)
BUN: 42 mg/dL — ABNORMAL HIGH (ref 8–23)
CO2: 25 mmol/L (ref 22–32)
Calcium: 9.7 mg/dL (ref 8.9–10.3)
Chloride: 108 mmol/L (ref 98–111)
Creatinine, Ser: 2.54 mg/dL — ABNORMAL HIGH (ref 0.44–1.00)
GFR, Estimated: 20 mL/min — ABNORMAL LOW (ref >60)
Glucose, Bld: 77 mg/dL (ref 70–99)
Potassium: 3.9 mmol/L (ref 3.5–5.1)
Sodium: 144 mmol/L (ref 135–145)

## 2020-12-13 LAB — MAGNESIUM: Magnesium: 2.4 mg/dL (ref 1.7–2.4)

## 2020-12-13 LAB — UREA NITROGEN, URINE: Urea Nitrogen, Ur: 556 mg/dL

## 2020-12-13 MED ORDER — SODIUM CHLORIDE 0.9 % IV SOLN: INTRAVENOUS | Status: DC

## 2020-12-13 NOTE — Progress Notes (Addendum)
PROGRESS NOTE    Lindsay Bowen  [id number] DOB: 11-26-1950 DOA: 12/11/2020 PCP: Kathyrn Lass, MD  Brief Narrative: 70 year old female with past medical history of asthma, hyperlipidemia, hypertension who presents to [facility] emergency department after being instructed by her primary care provider that she has "abnormal labs."     Patient explains that she had blood work with [facility] within the past 1 to 2 weeks.  She states that these were routine labs and that she has not experienced any new symptoms as of late.  Patient denies shortness of breath, fevers, abdominal pain, dysuria, diarrhea, changes in appetite, recent travel, sick contacts or contact with confirmed COVID-19 infection.  Patient denies regular NSAID use or illicit drug use.   Upon further questioning however patient does admit to a 1 year history of severe unrelenting depression since the death of her husband last 2022-12-30.  This is led to extremely poor oral intake as the patient eats and drinks very little.  Patient reports at least a 40 pound weight loss over the span of time as a result.   Patient was contacted by her primary care provider office and instructed to come in for evaluation.  Patient then presented to [facility] emergency department for evaluation.   Upon evaluation in the emergency department, chemistry did confirm the patient has a markedly elevated creatinine of 4.65 with elevated BUN of 57.  Patient was initiated on intravenous hydration.  Case was discussed with Dr. Carolin Sicks with nephrology who recommended hospitalization for work-up of patient's surprisingly substantial renal injury.  The hospitalist group was then called to assess the patient for admission to the hospital.  Assessment & Plan:   Principal Problem:   Acute kidney injury [facility]) Active Problems:   Essential hypertension   Mixed hyperlipidemia   Mild intermittent asthma, uncomplicated   Hypokalemia    Hyperbilirubinemia   Leukocytosis   Major depressive disorder   #1 AKI secondary to multifactorial reasons Lasix ACE inhibitor decreased p.o. intake.  Renal functions improving on IV fluids since admission.  Renal ultrasound shows no hydronephrosis or stones. Creatinine 3.03 from 4.65 on admission. Continue NS Continue to hold HCTZ and Cozaar. Work-up includes CT of the abdomen and pelvis no acute process, 4 mm right lower lobe lung nodule.  Left ovarian prominence for age is mild. Pelvic ultrasound shows normal appearance of the right ovary and 3.7 cm complicated left adnexal cyst.  Follow-up ultrasound in 3 months.  #2 history of essential hypertension stable on Norvasc.  #3 history of hyperlipidemia continue statin  #4 history of asthma stable  #5 depression her husband died in 12/30/2019 She was started on Lexapro this admission.  #6 mildly elevated total bilirubin decreasing.   Estimated body mass index is 28.03 kg/m as calculated from the following:   Height as of 07/10/11: 5' 1.75" (1.568 m).   Weight as of this encounter: 68.9 kg.  DVT prophylaxis: Heparin Code Status: Full code Family Communication: None at bedside Disposition Plan:  Status is: Inpatient  Remains inpatient appropriate because:Persistent severe electrolyte disturbances  Dispo: The patient is from: Home              Anticipated d/c is to: Home              Patient currently is not medically stable to d/c.   Difficult to place patient No    Consultants: None  Procedures: None Antimicrobials: None  Subjective: Patient is resting in bed she  complains of some nausea and threw up her Ensure today denies any abdominal pain  Objective: Vitals:   12/12/20 1707 12/12/20 2035 12/13/20 0019 12/13/20 0444  BP: 139/66 125/62 132/68 122/61  Pulse: 91 85 83 79  Resp: '16 18 18 16  '$ Temp: 98 F (36.7 C) 98.4 F (36.9 C) 98.6 F (37 C) 98.5 F (36.9 C)  TempSrc: Oral Oral Oral Oral  SpO2: 99% 98%  98% 98%  Weight:        Intake/Output Summary (Last 24 hours) at 12/13/2020 1041 Last data filed at 12/13/2020 0700 Gross per 24 hour  Intake 357 ml  Output 120 ml  Net 237 ml   Filed Weights   12/11/20 2008  Weight: 68.9 kg    Examination:  General exam: Appears calm and comfortable  Respiratory system: Clear to auscultation. Respiratory effort normal. Cardiovascular system: S1 & S2 heard, RRR. No JVD, murmurs, rubs, gallops or clicks. No pedal edema. Gastrointestinal system: Abdomen is nondistended, soft and nontender. No organomegaly or masses felt. Normal bowel sounds heard. Central nervous system: Alert and oriented. No focal neurological deficits. Extremities: Symmetric 5 x 5 power. Skin: No rashes, lesions or ulcers Psychiatry: Judgement and insight appear normal. Mood & affect appropriate.     Data Reviewed: I have personally reviewed following labs and imaging studies  CBC: Recent Labs  Lab 12/11/20 2024 12/12/20 0437 12/13/20 0550  WBC 14.5* 13.0* 10.7*  NEUTROABS  --  9.6*  --   HGB 13.9 11.9* 12.3  HCT 40.8 35.7* 35.8*  MCV 85.7 86.7 84.8  PLT 276 215 [id number]   Basic Metabolic Panel: Recent Labs  Lab 12/11/20 2024 12/12/20 0437 12/12/20 1701  NA 140 141 143  K 3.3* 3.4* 3.9  CL 101 101 107  CO2 '24 27 23  '$ GLUCOSE 118* 78 87  BUN 57* 54* 49*  CREATININE 4.65* 4.00* 3.03*  CALCIUM 10.0 9.5 9.6  MG  --  2.5*  --    GFR: CrCl cannot be calculated (Unknown ideal weight.). Liver Function Tests: Recent Labs  Lab 12/11/20 2024 12/12/20 0437  AST 16 15  ALT 12 10  ALKPHOS 52 43  BILITOT 2.5* 1.9*  PROT 8.3* 7.1  ALBUMIN 4.9 4.1   No results for input(s): LIPASE, AMYLASE in the last 168 hours. No results for input(s): AMMONIA in the last 168 hours. Coagulation Profile: No results for input(s): INR, PROTIME in the last 168 hours. Cardiac Enzymes: Recent Labs  Lab 12/12/20 0437  CKTOTAL 88   BNP (last 3 results) No results for input(s):  PROBNP in the last 8760 hours. HbA1C: No results for input(s): HGBA1C in the last 72 hours. CBG: No results for input(s): GLUCAP in the last 168 hours. Lipid Profile: No results for input(s): CHOL, HDL, LDLCALC, TRIG, CHOLHDL, LDLDIRECT in the last 72 hours. Thyroid Function Tests: No results for input(s): TSH, T4TOTAL, FREET4, T3FREE, THYROIDAB in the last 72 hours. Anemia Panel: No results for input(s): VITAMINB12, FOLATE, FERRITIN, TIBC, IRON, RETICCTPCT in the last 72 hours. Sepsis Labs: Recent Labs  Lab 12/12/20 0437  PROCALCITON 0.10    Recent Results (from the past 240 hour(s))  Resp Panel by RT-PCR (Flu A&B, Covid) Nasopharyngeal Swab     Status: None   Collection Time: 12/11/20 10:27 PM   Specimen: Nasopharyngeal Swab; Nasopharyngeal(NP) swabs in vial transport medium  Result Value Ref Range Status   SARS Coronavirus 2 by RT PCR NEGATIVE NEGATIVE Final    Comment: (NOTE) SARS-CoV-2 target nucleic  acids are NOT DETECTED.  The SARS-CoV-2 RNA is generally detectable in upper respiratory specimens during the acute phase of infection. The lowest concentration of SARS-CoV-2 viral copies this assay can detect is 138 copies/mL. A negative result does not preclude SARS-Cov-2 infection and should not be used as the sole basis for treatment or other patient management decisions. A negative result may occur with  improper specimen collection/handling, submission of specimen other than nasopharyngeal swab, presence of viral mutation(s) within the areas targeted by this assay, and inadequate number of viral copies(<138 copies/mL). A negative result must be combined with clinical observations, patient history, and epidemiological information. The expected result is Negative.  Fact Sheet for Patients:  EntrepreneurPulse.com.au  Fact Sheet for Healthcare Providers:  IncredibleEmployment.be  This test is no t yet approved or cleared by the  [place] FDA and  has been authorized for detection and/or diagnosis of SARS-CoV-2 by FDA under an Emergency Use Authorization (EUA). This EUA will remain  in effect (meaning this test can be used) for the duration of the COVID-19 declaration under Section 564(b)(1) of the Act, 21 U.S.C.section 360bbb-3(b)(1), unless the authorization is terminated  or revoked sooner.       Influenza A by PCR NEGATIVE NEGATIVE Final   Influenza B by PCR NEGATIVE NEGATIVE Final    Comment: (NOTE) The Xpert Xpress SARS-CoV-2/FLU/RSV plus assay is intended as an aid in the diagnosis of influenza from Nasopharyngeal swab specimens and should not be used as a sole basis for treatment. Nasal washings and aspirates are unacceptable for Xpert Xpress SARS-CoV-2/FLU/RSV testing.  Fact Sheet for Patients: EntrepreneurPulse.com.au  Fact Sheet for Healthcare Providers: IncredibleEmployment.be  This test is not yet approved or cleared by the [place] FDA and has been authorized for detection and/or diagnosis of SARS-CoV-2 by FDA under an Emergency Use Authorization (EUA). This EUA will remain in effect (meaning this test can be used) for the duration of the COVID-19 declaration under Section 564(b)(1) of the Act, 21 U.S.C. section 360bbb-3(b)(1), unless the authorization is terminated or revoked.  Performed at [facility], [place] [address], [place], [place] [address]          Radiology Studies: CT ABDOMEN PELVIS WO CONTRAST  Result Date: 12/12/2020 CLINICAL DATA:  Unexplained weight loss over 2 months. New onset renal failure. EXAM: CT ABDOMEN AND PELVIS WITHOUT CONTRAST TECHNIQUE: Multidetector CT imaging of the abdomen and pelvis was performed following the standard protocol without IV contrast. COMPARISON:  Abdominal ultrasound of 12/12/2020 FINDINGS: Lower chest: 4 mm right lower lobe pulmonary nodule on 05/04. Normal heart size without  pericardial or pleural effusion. Hepatobiliary: Normal liver. Cholecystectomy, without biliary ductal dilatation. Pancreas: Normal, without mass or ductal dilatation. Spleen: Normal in size, without focal abnormality. Adrenals/Urinary Tract: Right adrenal calcification is likely related to remote infection or trauma. Normal left adrenal gland. No renal calculi or hydronephrosis. No hydroureter or ureteric calculi. No bladder calculi. Stomach/Bowel: Normal stomach, without wall thickening. Extensive colonic diverticulosis. Normal terminal ileum. Normal small bowel. Vascular/Lymphatic: Aortic atherosclerosis. Prior common and external iliac artery stents bilaterally. No abdominopelvic adenopathy. Reproductive: Hysterectomy. Mild left ovarian prominence for age including 3.8 cm on 57/2. Other: Pelvic floor laxity. No significant free fluid. No free intraperitoneal air. Musculoskeletal: No acute osseous abnormality. IMPRESSION: 1.  No acute process or explanation for weight loss. 2. 4 mm right lower lobe pulmonary nodule. No follow-up needed if patient is low-risk. Non-contrast chest CT can be considered in 12 months if patient is high-risk.  This recommendation follows the consensus statement: Guidelines for Management of Incidental Pulmonary Nodules Detected on CT Images: From the Fleischner Society 2017; Radiology 2017; 284:228-243. 3. Left ovarian prominence for age is mild. Most likely due to underlying residual follicles. Correlate with left lower quadrant symptoms. Consider pelvic ultrasound. Electronically Signed   By: Abigail Miyamoto M.D.   On: 12/12/2020 10:29   DG Chest 1 View  Result Date: 12/12/2020 CLINICAL DATA:  History of asthma. EXAM: CHEST  1 VIEW COMPARISON:  None. FINDINGS: Mild diffuse chronic interstitial coarsening. No focal consolidation, pleural effusion, or pneumothorax. The cardiac silhouette is within normal limits. Acute osseous pathology. Degenerative changes of the spine. IMPRESSION: No  active disease. Electronically Signed   By: Anner Crete M.D.   On: 12/12/2020 03:02   US RENAL  Result Date: 12/11/2020 CLINICAL DATA:  Renal failure EXAM: RENAL / URINARY TRACT ULTRASOUND COMPLETE COMPARISON:  None. FINDINGS: Right Kidney: Renal measurements: 9.6 x 4.4 x 4.2 cm = volume: 92.4 mL. Cortex is slightly echogenic. No mass or hydronephrosis. Left Kidney: Renal measurements: 9.6 x 4.7 x 5 cm = volume: 116.8 mL. Cortex is slightly echogenic. No mass or hydronephrosis. Bladder: Appears normal for degree of bladder distention. Other: None. IMPRESSION: Slightly echogenic kidneys consistent with medical renal disease. No hydronephrosis Electronically Signed   By: Donavan Foil M.D.   On: 12/11/2020 23:43   US PELVIC COMPLETE WITH TRANSVAGINAL  Result Date: 12/12/2020 CLINICAL DATA:  Abnormal CT. EXAM: TRANSABDOMINAL AND TRANSVAGINAL ULTRASOUND OF PELVIS TECHNIQUE: Both transabdominal and transvaginal ultrasound examinations of the pelvis were performed. Transabdominal technique was performed for global imaging of the pelvis including uterus, ovaries, adnexal regions, and pelvic cul-de-sac. It was necessary to proceed with endovaginal exam following the transabdominal exam to visualize the adnexa. COMPARISON:  CT of the abdomen December 12, 2020 FINDINGS: Uterus Measurements: Surgically absent. Right ovary Measurements: 3.8 x 1.3 x 2.3 cm = volume: 5.8 mL. Normal appearance/no adnexal mass. Left ovary Measurements: 5.2 x 2.3 x 2.2 cm = volume: 13.8 mL. There is a complex cystic structure on the left ovary measuring 3.7 x 2.3 x 1.6 cm Other findings No abnormal free fluid. IMPRESSION: Surgically absent uterus. Normal appearance of the right ovary. 3.7 cm complicated left adnexal cyst. Recommend followup US in 3-6 months. Note: This recommendation does not apply to premenarchal patients or to those with increased risk (genetic, family history, elevated tumor markers or other high-risk factors) of  ovarian cancer. Reference: Radiology 2019 Nov; 293(2):359-371. Electronically Signed   By: Fidela Salisbury M.D.   On: 12/12/2020 19:04   US Abdomen Limited RUQ (LIVER/GB)  Result Date: 12/12/2020 CLINICAL DATA:  Elevated liver function tests EXAM: ULTRASOUND ABDOMEN LIMITED RIGHT UPPER QUADRANT COMPARISON:  None. FINDINGS: Gallbladder: Absent Common bile duct: Diameter: 6 mm in proximal diameter Liver: No focal lesion identified. Within normal limits in parenchymal echogenicity. Portal vein is patent on color Doppler imaging with normal direction of blood flow towards the liver. Other: None. IMPRESSION: Status post cholecystectomy. Normal examination of the liver. Electronically Signed   By: Fidela Salisbury M.D.   On: 12/12/2020 02:37        Scheduled Meds:  amLODipine  5 mg Oral BID   atorvastatin  40 mg Oral Daily   escitalopram  10 mg Oral Daily   fluticasone furoate-vilanterol  1 puff Inhalation Daily   heparin  5,000 Units Subcutaneous Q8H   Continuous Infusions:  sodium chloride 100 mL/hr at 12/13/20 0935  LOS: 2 days    Time spent: 40 min    Georgette Shell, MD 12/13/2020, 10:41 AM

## 2020-12-14 ENCOUNTER — Encounter (HOSPITAL_COMMUNITY): Payer: Self-pay | Admitting: Internal Medicine

## 2020-12-14 ENCOUNTER — Inpatient Hospital Stay (HOSPITAL_COMMUNITY): Payer: Medicare Other

## 2020-12-14 LAB — BASIC METABOLIC PANEL
Anion gap: 12 (ref 5–15)
BUN: 36 mg/dL — ABNORMAL HIGH (ref 8–23)
CO2: 22 mmol/L (ref 22–32)
Calcium: 8.9 mg/dL (ref 8.9–10.3)
Chloride: 108 mmol/L (ref 98–111)
Creatinine, Ser: 2.28 mg/dL — ABNORMAL HIGH (ref 0.44–1.00)
GFR, Estimated: 23 mL/min — ABNORMAL LOW (ref >60)
Glucose, Bld: 70 mg/dL (ref 70–99)
Potassium: 3.8 mmol/L (ref 3.5–5.1)
Sodium: 142 mmol/L (ref 135–145)

## 2020-12-14 MED ORDER — PANTOPRAZOLE SODIUM 40 MG PO TBEC
40.0000 mg | DELAYED_RELEASE_TABLET | Freq: Every day | ORAL | Status: DC
  Administered 2020-12-14 – 2020-12-15 (×2): 40 mg via ORAL
  Filled 2020-12-14 (×2): qty 1

## 2020-12-14 NOTE — Progress Notes (Signed)
Pt to CT via transport.

## 2020-12-14 NOTE — Evaluation (Signed)
Clinical/Bedside Swallow Evaluation Patient Details  Name: Lindsay Bowen MRN: [id number] Date of Birth: 1951/01/16  Today's Date: 12/14/2020 Time: SLP Start Time (ACUTE ONLY): 33 SLP Stop Time (ACUTE ONLY): 13 SLP Time Calculation (min) (ACUTE ONLY): 20 min  Past Medical History:  Past Medical History:  Diagnosis Date   Asthma    Past Surgical History:  Past Surgical History:  Procedure Laterality Date   CHOLECYSTECTOMY     HPI:  Patient is a 70 y.o. female with PMH: asthma, hyperlipidemia, hypertension who presents to [facility] emergency department after being instructed by her primary care provider that she has "abnormal labs."  In ED, patient did admit to one year history of severe unrelenting depression since the death of her husband last 2023-01-15 (had been married over 67 years). This apparently led to extremely poor oral intake and patient reporting a 40lb weight loss. Patient c/o difficulty swallowing and sensation of food getting stuck in her throat, prompting this BSE with SLP.   Assessment / Plan / Recommendation Clinical Impression  Patient presents with an oropharyngeal swallow that appears WNL and SLP suspecting her symptoms arise from possible esophageal dysphagia. In addition, patient has been reportedly eating very little since her husband passed away 01-15-23 of last year and so decreased fluid and solid intake may also be contributing to her swallowing difficulty/perceived swallowing difficulty. Patient did exhibit a couple instances of belching and c/o globus sensation in her throat. SLP contacted MD reporting these potential GERD symptoms. SLP encouraged patient to try to eat some solid foods she thinks she could tolerate (toast with jelly, bland/mildly seasoned foods, etc). No SLP f/u recommended at this time. SLP Visit Diagnosis: Dysphagia, unspecified (R13.10)    Aspiration Risk  No limitations    Diet Recommendation Regular;Thin liquid   Liquid  Administration via: Straw;Cup Medication Administration: Whole meds with liquid Supervision: Patient able to self feed Postural Changes: Seated upright at 90 degrees    Other  Recommendations Oral Care Recommendations: Oral care BID;Patient independent with oral care   Follow up Recommendations None      Frequency and Duration   N/A         Prognosis   N/A     Swallow Study   General Date of Onset: 12/11/20 HPI: Patient is a 70 y.o. female with PMH: asthma, hyperlipidemia, hypertension who presents to [facility] emergency department after being instructed by her primary care provider that she has "abnormal labs."  In ED, patient did admit to one year history of severe unrelenting depression since the death of her husband last 15-Jan-2023 (had been married over 106 years). This apparently led to extremely poor oral intake and patient reporting a 40lb weight loss. Patient c/o difficulty swallowing and sensation of food getting stuck in her throat, prompting this BSE with SLP. Type of Study: Bedside Swallow Evaluation Previous Swallow Assessment: None found Diet Prior to this Study: Regular;Thin liquids Temperature Spikes Noted: No Respiratory Status: Room air History of Recent Intubation: No Behavior/Cognition: Pleasant mood;Cooperative;Alert Oral Cavity Assessment: Within Functional Limits Oral Care Completed by SLP: No Oral Cavity - Dentition: Adequate natural dentition;Missing dentition Vision: Functional for self-feeding Self-Feeding Abilities: Able to feed self Patient Positioning: Upright in bed Baseline Vocal Quality: Normal Volitional Cough: Strong Volitional Swallow: Able to elicit    Oral/Motor/Sensory Function Overall Oral Motor/Sensory Function: Within functional limits   Ice Chips     Thin Liquid Thin Liquid: Within functional limits Presentation: Straw;Self Fed  Nectar Thick     Honey Thick     Puree Puree: Not tested   Solid     Solid: Not  tested      Sonia Baller, MA, CCC-SLP Speech Therapy

## 2020-12-14 NOTE — Progress Notes (Addendum)
PROGRESS NOTE    Lindsay Bowen  [id number] DOB: 1951-02-26 DOA: 12/11/2020 PCP: Kathyrn Lass, MD  Brief Narrative: 70 year old female with past medical history of asthma, hyperlipidemia, hypertension who presents to [facility] emergency department after being instructed by her primary care provider that she has "abnormal labs."     Patient explains that she had blood work with [facility] within the past 1 to 2 weeks.  She states that these were routine labs and that she has not experienced any new symptoms as of late.  Patient denies shortness of breath, fevers, abdominal pain, dysuria, diarrhea, changes in appetite, recent travel, sick contacts or contact with confirmed COVID-19 infection.  Patient denies regular NSAID use or illicit drug use.   Upon further questioning however patient does admit to a 1 year history of severe unrelenting depression since the death of her husband last 2023-01-06.  This is led to extremely poor oral intake as the patient eats and drinks very little.  Patient reports at least a 40 pound weight loss over the span of time as a result.   Patient was contacted by her primary care provider office and instructed to come in for evaluation.  Patient then presented to [facility] emergency department for evaluation.   Upon evaluation in the emergency department, chemistry did confirm the patient has a markedly elevated creatinine of 4.65 with elevated BUN of 57.  Patient was initiated on intravenous hydration.  Case was discussed with Dr. Carolin Sicks with nephrology who recommended hospitalization for work-up of patient's surprisingly substantial renal injury.  The hospitalist group was then called to assess the patient for admission to the hospital.  Assessment & Plan:   Principal Problem:   Acute kidney injury [facility]) Active Problems:   Essential hypertension   Mixed hyperlipidemia   Mild intermittent asthma, uncomplicated   Hypokalemia    Hyperbilirubinemia   Leukocytosis   Major depressive disorder   #1 AKI secondary to multifactorial reasons Lasix ACE inhibitor decreased p.o. intake.  Renal functions improving on IV fluids since admission.  Renal ultrasound shows no hydronephrosis or stones. Creatinine 2.28 from 3.03 from 4.65 on admission. Continue NS Continue to hold HCTZ and Cozaar. Work-up includes CT of the abdomen and pelvis no acute process, 4 mm right lower lobe lung nodule.  Left ovarian prominence for age is mild. Pelvic ultrasound shows normal appearance of the right ovary and 3.7 cm complicated left adnexal cyst.  Follow-up ultrasound in 3 months. Likely home in a.m. with improvement in renal functions. Out of bed ambulate  #2 history of essential hypertension stable without any medications  #3 history of hyperlipidemia continue statin  #4 history of asthma stable  #5 depression her husband died in Jan 06, 2020 She was started on Lexapro this admission.  #6 mildly elevated total bilirubin decreasing.  #7 possible GERD trial of Protonix   Estimated body mass index is 28.03 kg/m as calculated from the following:   Height as of 07/10/11: 5' 1.75" (1.568 m).   Weight as of this encounter: 68.9 kg.  DVT prophylaxis: Heparin Code Status: Full code Family Communication: None at bedside Disposition Plan:  Status is: Inpatient  Remains inpatient appropriate because:Persistent severe electrolyte disturbances  Dispo: The patient is from: Home              Anticipated d/c is to: Home              Patient currently is not medically stable to d/c.  Difficult to place patient No    Consultants: None  Procedures: None Antimicrobials: None  Subjective: She had some belching seen by speech therapist this morning at bedside possible GERD no vomiting overnight no diarrhea or abdominal pain Objective: Vitals:   12/13/20 1334 12/13/20 2114 12/14/20 0503 12/14/20 0754  BP: (!) 106/55 130/62 120/65    Pulse: 75 83 69   Resp: '16 19 18   '$ Temp: 97.7 F (36.5 C) 98.2 F (36.8 C) 98.1 F (36.7 C)   TempSrc: Oral Oral Oral   SpO2: 95% 96% 96% 96%  Weight:        Intake/Output Summary (Last 24 hours) at 12/14/2020 1050 Last data filed at 12/14/2020 0910 Gross per 24 hour  Intake 2079.79 ml  Output --  Net 2079.79 ml    Filed Weights   12/11/20 2008  Weight: 68.9 kg    Examination:  General exam: Appears calm and comfortable  Respiratory system: Clear to auscultation. Respiratory effort normal. Cardiovascular system: S1 & S2 heard, RRR. No JVD, murmurs, rubs, gallops or clicks. No pedal edema. Gastrointestinal system: Abdomen is nondistended, soft and nontender. No organomegaly or masses felt. Normal bowel sounds heard. Central nervous system: Alert and oriented. No focal neurological deficits. Extremities: Symmetric 5 x 5 power. Skin: No rashes, lesions or ulcers Psychiatry: Judgement and insight appear normal. Mood & affect appropriate.     Data Reviewed: I have personally reviewed following labs and imaging studies  CBC: Recent Labs  Lab 12/11/20 2024 12/12/20 0437 12/13/20 0550  WBC 14.5* 13.0* 10.7*  NEUTROABS  --  9.6*  --   HGB 13.9 11.9* 12.3  HCT 40.8 35.7* 35.8*  MCV 85.7 86.7 84.8  PLT 276 215 [id number]    Basic Metabolic Panel: Recent Labs  Lab 12/11/20 2024 12/12/20 0437 12/12/20 1701 12/13/20 0550 12/14/20 0715  NA 140 141 143 144 142  K 3.3* 3.4* 3.9 3.9 3.8  CL 101 101 107 108 108  CO2 '24 27 23 25 22  '$ GLUCOSE 118* 78 87 77 70  BUN 57* 54* 49* 42* 36*  CREATININE 4.65* 4.00* 3.03* 2.54* 2.28*  CALCIUM 10.0 9.5 9.6 9.7 8.9  MG  --  2.5*  --  2.4  --     GFR: CrCl cannot be calculated (Unknown ideal weight.). Liver Function Tests: Recent Labs  Lab 12/11/20 2024 12/12/20 0437  AST 16 15  ALT 12 10  ALKPHOS 52 43  BILITOT 2.5* 1.9*  PROT 8.3* 7.1  ALBUMIN 4.9 4.1    No results for input(s): LIPASE, AMYLASE in the last 168  hours. No results for input(s): AMMONIA in the last 168 hours. Coagulation Profile: No results for input(s): INR, PROTIME in the last 168 hours. Cardiac Enzymes: Recent Labs  Lab 12/12/20 0437  CKTOTAL 88    BNP (last 3 results) No results for input(s): PROBNP in the last 8760 hours. HbA1C: No results for input(s): HGBA1C in the last 72 hours. CBG: No results for input(s): GLUCAP in the last 168 hours. Lipid Profile: No results for input(s): CHOL, HDL, LDLCALC, TRIG, CHOLHDL, LDLDIRECT in the last 72 hours. Thyroid Function Tests: No results for input(s): TSH, T4TOTAL, FREET4, T3FREE, THYROIDAB in the last 72 hours. Anemia Panel: No results for input(s): VITAMINB12, FOLATE, FERRITIN, TIBC, IRON, RETICCTPCT in the last 72 hours. Sepsis Labs: Recent Labs  Lab 12/12/20 0437  PROCALCITON 0.10     Recent Results (from the past 240 hour(s))  Resp Panel by RT-PCR (Flu A&B, Covid)  Nasopharyngeal Swab     Status: None   Collection Time: 12/11/20 10:27 PM   Specimen: Nasopharyngeal Swab; Nasopharyngeal(NP) swabs in vial transport medium  Result Value Ref Range Status   SARS Coronavirus 2 by RT PCR NEGATIVE NEGATIVE Final    Comment: (NOTE) SARS-CoV-2 target nucleic acids are NOT DETECTED.  The SARS-CoV-2 RNA is generally detectable in upper respiratory specimens during the acute phase of infection. The lowest concentration of SARS-CoV-2 viral copies this assay can detect is 138 copies/mL. A negative result does not preclude SARS-Cov-2 infection and should not be used as the sole basis for treatment or other patient management decisions. A negative result may occur with  improper specimen collection/handling, submission of specimen other than nasopharyngeal swab, presence of viral mutation(s) within the areas targeted by this assay, and inadequate number of viral copies(<138 copies/mL). A negative result must be combined with clinical observations, patient history, and  epidemiological information. The expected result is Negative.  Fact Sheet for Patients:  EntrepreneurPulse.com.au  Fact Sheet for Healthcare Providers:  IncredibleEmployment.be  This test is no t yet approved or cleared by the [place] FDA and  has been authorized for detection and/or diagnosis of SARS-CoV-2 by FDA under an Emergency Use Authorization (EUA). This EUA will remain  in effect (meaning this test can be used) for the duration of the COVID-19 declaration under Section 564(b)(1) of the Act, 21 U.S.C.section 360bbb-3(b)(1), unless the authorization is terminated  or revoked sooner.       Influenza A by PCR NEGATIVE NEGATIVE Final   Influenza B by PCR NEGATIVE NEGATIVE Final    Comment: (NOTE) The Xpert Xpress SARS-CoV-2/FLU/RSV plus assay is intended as an aid in the diagnosis of influenza from Nasopharyngeal swab specimens and should not be used as a sole basis for treatment. Nasal washings and aspirates are unacceptable for Xpert Xpress SARS-CoV-2/FLU/RSV testing.  Fact Sheet for Patients: EntrepreneurPulse.com.au  Fact Sheet for Healthcare Providers: IncredibleEmployment.be  This test is not yet approved or cleared by the [place] FDA and has been authorized for detection and/or diagnosis of SARS-CoV-2 by FDA under an Emergency Use Authorization (EUA). This EUA will remain in effect (meaning this test can be used) for the duration of the COVID-19 declaration under Section 564(b)(1) of the Act, 21 U.S.C. section 360bbb-3(b)(1), unless the authorization is terminated or revoked.  Performed at [facility], [place] [address], [place], [place] [address]           Radiology Studies: US PELVIC COMPLETE WITH TRANSVAGINAL  Result Date: 12/12/2020 CLINICAL DATA:  Abnormal CT. EXAM: TRANSABDOMINAL AND TRANSVAGINAL ULTRASOUND OF PELVIS TECHNIQUE: Both transabdominal  and transvaginal ultrasound examinations of the pelvis were performed. Transabdominal technique was performed for global imaging of the pelvis including uterus, ovaries, adnexal regions, and pelvic cul-de-sac. It was necessary to proceed with endovaginal exam following the transabdominal exam to visualize the adnexa. COMPARISON:  CT of the abdomen December 12, 2020 FINDINGS: Uterus Measurements: Surgically absent. Right ovary Measurements: 3.8 x 1.3 x 2.3 cm = volume: 5.8 mL. Normal appearance/no adnexal mass. Left ovary Measurements: 5.2 x 2.3 x 2.2 cm = volume: 13.8 mL. There is a complex cystic structure on the left ovary measuring 3.7 x 2.3 x 1.6 cm Other findings No abnormal free fluid. IMPRESSION: Surgically absent uterus. Normal appearance of the right ovary. 3.7 cm complicated left adnexal cyst. Recommend followup US in 3-6 months. Note: This recommendation does not apply to premenarchal patients or to those with increased risk (  genetic, family history, elevated tumor markers or other high-risk factors) of ovarian cancer. Reference: Radiology 2019 Nov; 293(2):359-371. Electronically Signed   By: Fidela Salisbury M.D.   On: 12/12/2020 19:04        Scheduled Meds:  amLODipine  5 mg Oral BID   atorvastatin  40 mg Oral Daily   escitalopram  10 mg Oral Daily   fluticasone furoate-vilanterol  1 puff Inhalation Daily   heparin  5,000 Units Subcutaneous Q8H   pantoprazole  40 mg Oral Daily   Continuous Infusions:  sodium chloride 100 mL/hr at 12/14/20 0434     LOS: 3 days    Time spent: 73 min    Georgette Shell, MD 12/14/2020, 10:50 AM

## 2020-12-14 NOTE — Care Management Important Message (Signed)
Important Message  Patient Details IM Letter given to the Patient. Name: Lindsay Bowen MRN: [id number] Date of Birth: December 23, 1950   Medicare Important Message Given:  Yes     Kerin Salen 12/14/2020, 11:48 AM

## 2020-12-14 NOTE — Plan of Care (Signed)
  Problem: Education: Goal: Knowledge of General Education information will improve Description Including pain rating scale, medication(s)/side effects and non-pharmacologic comfort measures Outcome: Progressing   

## 2020-12-15 LAB — BASIC METABOLIC PANEL
Anion gap: 8 (ref 5–15)
BUN: 29 mg/dL — ABNORMAL HIGH (ref 8–23)
CO2: 22 mmol/L (ref 22–32)
Calcium: 9 mg/dL (ref 8.9–10.3)
Chloride: 110 mmol/L (ref 98–111)
Creatinine, Ser: 1.86 mg/dL — ABNORMAL HIGH (ref 0.44–1.00)
GFR, Estimated: 29 mL/min — ABNORMAL LOW (ref >60)
Glucose, Bld: 72 mg/dL (ref 70–99)
Potassium: 3.5 mmol/L (ref 3.5–5.1)
Sodium: 140 mmol/L (ref 135–145)

## 2020-12-15 MED ORDER — AMLODIPINE BESYLATE 5 MG PO TABS: 5.0000 mg | ORAL_TABLET | Freq: Every day | ORAL | 11 refills | Status: DC

## 2020-12-15 MED ORDER — ESCITALOPRAM OXALATE 10 MG PO TABS: 10.0000 mg | ORAL_TABLET | Freq: Every day | ORAL | 2 refills | Status: AC

## 2020-12-15 MED ORDER — ESCITALOPRAM OXALATE 10 MG PO TABS: 10.0000 mg | ORAL_TABLET | Freq: Every day | ORAL | 2 refills | Status: DC

## 2020-12-15 NOTE — Discharge Summary (Signed)
Physician Discharge Summary  Lindsay Bowen [id number] DOB: Apr 08, 1950 DOA: 12/11/2020  PCP: Kathyrn Lass, MD  Admit date: 12/11/2020 Discharge date: 12/15/2020  Admitted From: home Disposition:  home  Recommendations for Outpatient Follow-up:  Follow up with PCP in 1-2 weeks Please obtain BMP/CBC in one week Please follow up with [place] [facility] She needs a follow-up pelvic ultrasound as she had complex left adnexal cyst.  Home Health:none Equipment/Devices:none Discharge Condition:stable CODE STATUS:full Diet recommendation: cardiac Brief/Interim Summary: 70 year old female with past medical history of asthma, hyperlipidemia, hypertension who presents to [facility] emergency department after being instructed by her primary care provider that she has "abnormal labs."     Patient explains that she had blood work with [facility] within the past 1 to 2 weeks.  She states that these were routine labs and that she has not experienced any new symptoms as of late.  Patient denies shortness of breath, fevers, abdominal pain, dysuria, diarrhea, changes in appetite, recent travel, sick contacts or contact with confirmed COVID-19 infection.  Patient denies regular NSAID use or illicit drug use.   Upon further questioning however patient does admit to a 1 year history of severe unrelenting depression since the death of her husband last 01/19/23.  This is led to extremely poor oral intake as the patient eats and drinks very little.  Patient reports at least a 40 pound weight loss over the span of time as a result.   Patient was contacted by her primary care provider office and instructed to come in for evaluation.  Patient then presented to [facility] emergency department for evaluation.   Upon evaluation in the emergency department, chemistry did confirm the patient has a markedly elevated creatinine of 4.65 with elevated BUN of 57.  Patient was initiated on intravenous  hydration.  Case was discussed with Dr. Carolin Sicks with nephrology who recommended hospitalization for work-up of patient's surprisingly substantial renal injury.  The hospitalist group was then called to assess the patient for admission to the hospital.  Discharge Diagnoses:  Principal Problem:   Acute kidney injury [facility]) Active Problems:   Essential hypertension   Mixed hyperlipidemia   Mild intermittent asthma, uncomplicated   Hypokalemia   Hyperbilirubinemia   Leukocytosis   Major depressive disorder    #1 AKI secondary to multifactorial reasons Lasix ACE inhibitor decreased p.o. intake.  Creatinine 1.86 on the day of discharge from 4.65 on admission.  Her kidney functions improved with IV hydration.Renal ultrasound shows no hydronephrosis or stones. I did not put her back on hydrochlorothiazide or Cozaar.  She was given Norvasc at a lower dose of 5 mg daily instead of 5 mg twice daily. Work-up included CT of the abdomen and pelvis no acute process, 4 mm right lower lobe lung nodule.  Left ovarian prominence for age is mild. Pelvic ultrasound shows normal appearance of the right ovary and 3.7 cm complicated left adnexal cyst.  Follow-up ultrasound in 3 months. CT chest without contrast showed no acute findings.   #2 history of essential hypertension stable on Norvasc.   #3 history of hyperlipidemia continue statin   #4 history of asthma stable   #5 depression her husband died in 01-19-20 She was started on Lexapro this admission.   #6 mildly elevated total bilirubin resolved.  Estimated body mass index is 28.72 kg/m as calculated from the following:   Height as of this encounter: '5\' 1"'$  (1.549 m).   Weight as of this encounter: 68.9 kg.  Discharge Instructions  Discharge Instructions     Diet - low sodium heart healthy   Complete by: As directed    Increase activity slowly   Complete by: As directed       Allergies as of 12/15/2020       Reactions   Red Dye  Itching        Medication List     STOP taking these medications    hydrochlorothiazide 25 MG tablet Commonly known as: HYDRODIURIL   losartan 100 MG tablet Commonly known as: COZAAR       TAKE these medications    amLODipine 5 MG tablet Commonly known as: NORVASC Take 1 tablet (5 mg total) by mouth daily. What changed:  medication strength when to take this additional instructions   atorvastatin 40 MG tablet Commonly known as: LIPITOR Take 40 mg by mouth daily.   escitalopram 10 MG tablet Commonly known as: LEXAPRO Take 1 tablet (10 mg total) by mouth daily.   Fluticasone-Salmeterol 100-50 MCG/DOSE Aepb Commonly known as: Advair Diskus Inhale 1 puff into the lungs 2 (two) times daily. Need office visit for additional refills.        Follow-up Information     Kathyrn Lass, MD Follow up.   Specialty: Family Medicine Contact information: [place] [address] [phone]         [facility] Follow up.   Why: memory issues Contact information: [address]     [place] [phone]               Allergies  Allergen Reactions   Red Dye Itching    Consultations: none   Procedures/Studies: CT ABDOMEN PELVIS WO CONTRAST  Result Date: 12/12/2020 CLINICAL DATA:  Unexplained weight loss over 2 months. New onset renal failure. EXAM: CT ABDOMEN AND PELVIS WITHOUT CONTRAST TECHNIQUE: Multidetector CT imaging of the abdomen and pelvis was performed following the standard protocol without IV contrast. COMPARISON:  Abdominal ultrasound of 12/12/2020 FINDINGS: Lower chest: 4 mm right lower lobe pulmonary nodule on 05/04. Normal heart size without pericardial or pleural effusion. Hepatobiliary: Normal liver. Cholecystectomy, without biliary ductal dilatation. Pancreas: Normal, without mass or ductal dilatation. Spleen: Normal in size, without focal abnormality.  Adrenals/Urinary Tract: Right adrenal calcification is likely related to remote infection or trauma. Normal left adrenal gland. No renal calculi or hydronephrosis. No hydroureter or ureteric calculi. No bladder calculi. Stomach/Bowel: Normal stomach, without wall thickening. Extensive colonic diverticulosis. Normal terminal ileum. Normal small bowel. Vascular/Lymphatic: Aortic atherosclerosis. Prior common and external iliac artery stents bilaterally. No abdominopelvic adenopathy. Reproductive: Hysterectomy. Mild left ovarian prominence for age including 3.8 cm on 57/2. Other: Pelvic floor laxity. No significant free fluid. No free intraperitoneal air. Musculoskeletal: No acute osseous abnormality. IMPRESSION: 1.  No acute process or explanation for weight loss. 2. 4 mm right lower lobe pulmonary nodule. No follow-up needed if patient is low-risk. Non-contrast chest CT can be considered in 12 months if patient is high-risk. This recommendation follows the consensus statement: Guidelines for Management of Incidental Pulmonary Nodules Detected on CT Images: From the Fleischner Society 2017; Radiology 2017; 284:228-243. 3. Left ovarian prominence for age is mild. Most likely due to underlying residual follicles. Correlate with left lower quadrant symptoms. Consider pelvic ultrasound. Electronically Signed   By: Abigail Miyamoto M.D.   On: 12/12/2020 10:29   DG Chest 1 View  Result Date: 12/12/2020 CLINICAL DATA:  History of asthma. EXAM: CHEST  1 VIEW COMPARISON:  None. FINDINGS: Mild diffuse chronic interstitial coarsening. No focal consolidation, pleural effusion, or pneumothorax. The cardiac silhouette is within normal limits. Acute osseous pathology. Degenerative changes of the spine. IMPRESSION: No active disease. Electronically Signed   By: Anner Crete M.D.   On: 12/12/2020 03:02   CT CHEST WO CONTRAST  Result Date: 12/14/2020 CLINICAL DATA:  Unintended weight loss.  Admitted for abnormal labs EXAM: CT  CHEST WITHOUT CONTRAST TECHNIQUE: Multidetector CT imaging of the chest was performed following the standard protocol without IV contrast. COMPARISON:  CT abdomen pelvis 12/12/2020 FINDINGS: Cardiovascular: Normal heart size. No significant pericardial effusion. The thoracic aorta is normal in caliber. Mild atherosclerotic plaque of the thoracic aorta. No coronary artery calcifications. Mediastinum/Nodes: No gross hilar adenopathy, noting limited sensitivity for the detection of hilar adenopathy on this noncontrast study. No enlarged mediastinal or axillary lymph nodes. Thyroid gland, trachea, and esophagus demonstrate no significant findings. Lungs/Pleura: Bilateral lower lobe subsegmental atelectasis. No focal consolidation. No pulmonary nodule. No pulmonary mass. No pleural effusion. No pneumothorax. Upper Abdomen: Nonspecific curvilinear calcifications right adrenal gland. Status post cholecystectomy. Otherwise no acute abnormality. Musculoskeletal: No chest wall abnormality. No suspicious lytic or blastic osseous lesions. No acute displaced fracture. Multilevel degenerative changes of the spine. IMPRESSION: No acute intrathoracic abnormality with limited evaluation on this noncontrast study. Electronically Signed   By: Iven Finn M.D.   On: 12/14/2020 15:14   US RENAL  Result Date: 12/11/2020 CLINICAL DATA:  Renal failure EXAM: RENAL / URINARY TRACT ULTRASOUND COMPLETE COMPARISON:  None. FINDINGS: Right Kidney: Renal measurements: 9.6 x 4.4 x 4.2 cm = volume: 92.4 mL. Cortex is slightly echogenic. No mass or hydronephrosis. Left Kidney: Renal measurements: 9.6 x 4.7 x 5 cm = volume: 116.8 mL. Cortex is slightly echogenic. No mass or hydronephrosis. Bladder: Appears normal for degree of bladder distention. Other: None. IMPRESSION: Slightly echogenic kidneys consistent with medical renal disease. No hydronephrosis Electronically Signed   By: Donavan Foil M.D.   On: 12/11/2020 23:43   US PELVIC COMPLETE  WITH TRANSVAGINAL  Result Date: 12/12/2020 CLINICAL DATA:  Abnormal CT. EXAM: TRANSABDOMINAL AND TRANSVAGINAL ULTRASOUND OF PELVIS TECHNIQUE: Both transabdominal and transvaginal ultrasound examinations of the pelvis were performed. Transabdominal technique was performed for global imaging of the pelvis including uterus, ovaries, adnexal regions, and pelvic cul-de-sac. It was necessary to proceed with endovaginal exam following the transabdominal exam to visualize the adnexa. COMPARISON:  CT of the abdomen December 12, 2020 FINDINGS: Uterus Measurements: Surgically absent. Right ovary Measurements: 3.8 x 1.3 x 2.3 cm = volume: 5.8 mL. Normal appearance/no adnexal mass. Left ovary Measurements: 5.2 x 2.3 x 2.2 cm = volume: 13.8 mL. There is a complex cystic structure on the left ovary measuring 3.7 x 2.3 x 1.6 cm Other findings No abnormal free fluid. IMPRESSION: Surgically absent uterus. Normal appearance of the right ovary. 3.7 cm complicated left adnexal cyst. Recommend followup US in 3-6 months. Note: This recommendation does not apply to premenarchal patients or to those with increased risk (genetic, family history, elevated tumor markers or other high-risk factors) of ovarian cancer. Reference: Radiology 2019 Nov; 293(2):359-371. Electronically Signed   By: Fidela Salisbury M.D.   On: 12/12/2020 19:04   US Abdomen Limited RUQ (LIVER/GB)  Result Date: 12/12/2020 CLINICAL DATA:  Elevated liver function tests EXAM: ULTRASOUND ABDOMEN LIMITED RIGHT UPPER QUADRANT COMPARISON:  None. FINDINGS: Gallbladder: Absent Common bile duct: Diameter: 6 mm in proximal diameter Liver: No focal lesion identified. Within normal  limits in parenchymal echogenicity. Portal vein is patent on color Doppler imaging with normal direction of blood flow towards the liver. Other: None. IMPRESSION: Status post cholecystectomy. Normal examination of the liver. Electronically Signed   By: Fidela Salisbury M.D.   On: 12/12/2020 02:37    (Echo, Carotid, EGD, Colonoscopy, ERCP)    Subjective: He is resting in bed she is anxious to go home She denies any nausea vomiting or diarrhea.  Discharge Exam: Vitals:   12/15/20 0442 12/15/20 0842  BP: (!) 142/66   Pulse: 69   Resp: 18   Temp: 98.3 F (36.8 C)   SpO2: 97% 98%   Vitals:   12/14/20 1421 12/14/20 1956 12/15/20 0442 12/15/20 0842  BP: (!) 131/57 134/66 (!) 142/66   Pulse: 87 77 69   Resp: '14 18 18   '$ Temp: 97.8 F (36.6 C) 98.5 F (36.9 C) 98.3 F (36.8 C)   TempSrc: Oral Oral Oral   SpO2: 95% 96% 97% 98%  Weight:      Height:  '5\' 1"'$  (1.549 m)      General: Pt is alert, awake, not in acute distress Cardiovascular: RRR, S1/S2 +, no rubs, no gallops Respiratory: CTA bilaterally, no wheezing, no rhonchi Abdominal: Soft, NT, ND, bowel sounds + Extremities: no edema, no cyanosis    The results of significant diagnostics from this hospitalization (including imaging, microbiology, ancillary and laboratory) are listed below for reference.     Microbiology: Recent Results (from the past 240 hour(s))  Resp Panel by RT-PCR (Flu A&B, Covid) Nasopharyngeal Swab     Status: None   Collection Time: 12/11/20 10:27 PM   Specimen: Nasopharyngeal Swab; Nasopharyngeal(NP) swabs in vial transport medium  Result Value Ref Range Status   SARS Coronavirus 2 by RT PCR NEGATIVE NEGATIVE Final    Comment: (NOTE) SARS-CoV-2 target nucleic acids are NOT DETECTED.  The SARS-CoV-2 RNA is generally detectable in upper respiratory specimens during the acute phase of infection. The lowest concentration of SARS-CoV-2 viral copies this assay can detect is 138 copies/mL. A negative result does not preclude SARS-Cov-2 infection and should not be used as the sole basis for treatment or other patient management decisions. A negative result may occur with  improper specimen collection/handling, submission of specimen other than nasopharyngeal swab, presence of viral mutation(s)  within the areas targeted by this assay, and inadequate number of viral copies(<138 copies/mL). A negative result must be combined with clinical observations, patient history, and epidemiological information. The expected result is Negative.  Fact Sheet for Patients:  EntrepreneurPulse.com.au  Fact Sheet for Healthcare Providers:  IncredibleEmployment.be  This test is no t yet approved or cleared by the [place] FDA and  has been authorized for detection and/or diagnosis of SARS-CoV-2 by FDA under an Emergency Use Authorization (EUA). This EUA will remain  in effect (meaning this test can be used) for the duration of the COVID-19 declaration under Section 564(b)(1) of the Act, 21 U.S.C.section 360bbb-3(b)(1), unless the authorization is terminated  or revoked sooner.       Influenza A by PCR NEGATIVE NEGATIVE Final   Influenza B by PCR NEGATIVE NEGATIVE Final    Comment: (NOTE) The Xpert Xpress SARS-CoV-2/FLU/RSV plus assay is intended as an aid in the diagnosis of influenza from Nasopharyngeal swab specimens and should not be used as a sole basis for treatment. Nasal washings and aspirates are unacceptable for Xpert Xpress SARS-CoV-2/FLU/RSV testing.  Fact Sheet for Patients: EntrepreneurPulse.com.au  Fact Sheet for Healthcare Providers: IncredibleEmployment.be  This test is not yet approved or cleared by the [place] and has been authorized for detection and/or diagnosis of SARS-CoV-2 by FDA under an Emergency Use Authorization (EUA). This EUA will remain in effect (meaning this test can be used) for the duration of the COVID-19 declaration under Section 564(b)(1) of the Act, 21 U.S.C. section 360bbb-3(b)(1), unless the authorization is terminated or revoked.  Performed at [facility], [place] [address] [place], [place] [address]      Labs: BNP (last 3  results) No results for input(s): BNP in the last 8760 hours. Basic Metabolic Panel: Recent Labs  Lab 12/12/20 0437 12/12/20 1701 12/13/20 0550 12/14/20 0715 12/15/20 0434  NA 141 143 144 142 140  K 3.4* 3.9 3.9 3.8 3.5  CL 101 107 108 108 110  CO2 '27 23 25 22 22  '$ GLUCOSE 78 87 77 70 72  BUN 54* 49* 42* 36* 29*  CREATININE 4.00* 3.03* 2.54* 2.28* 1.86*  CALCIUM 9.5 9.6 9.7 8.9 9.0  MG 2.5*  --  2.4  --   --    Liver Function Tests: Recent Labs  Lab 12/11/20 2024 12/12/20 0437  AST 16 15  ALT 12 10  ALKPHOS 52 43  BILITOT 2.5* 1.9*  PROT 8.3* 7.1  ALBUMIN 4.9 4.1   No results for input(s): LIPASE, AMYLASE in the last 168 hours. No results for input(s): AMMONIA in the last 168 hours. CBC: Recent Labs  Lab 12/11/20 2024 12/12/20 0437 12/13/20 0550  WBC 14.5* 13.0* 10.7*  NEUTROABS  --  9.6*  --   HGB 13.9 11.9* 12.3  HCT 40.8 35.7* 35.8*  MCV 85.7 86.7 84.8  PLT 276 215 230   Cardiac Enzymes: Recent Labs  Lab 12/12/20 0437  CKTOTAL 88   BNP: Invalid input(s): POCBNP CBG: No results for input(s): GLUCAP in the last 168 hours. D-Dimer No results for input(s): DDIMER in the last 72 hours. Hgb A1c No results for input(s): HGBA1C in the last 72 hours. Lipid Profile No results for input(s): CHOL, HDL, LDLCALC, TRIG, CHOLHDL, LDLDIRECT in the last 72 hours. Thyroid function studies No results for input(s): TSH, T4TOTAL, T3FREE, THYROIDAB in the last 72 hours.  Invalid input(s): FREET3 Anemia work up No results for input(s): VITAMINB12, FOLATE, FERRITIN, TIBC, IRON, RETICCTPCT in the last 72 hours. Urinalysis    Component Value Date/Time   COLORURINE YELLOW (A) 12/12/2020 0026   APPEARANCEUR HAZY (A) 12/12/2020 0026   LABSPEC 1.015 12/12/2020 0026   PHURINE 6.0 12/12/2020 0026   GLUCOSEU NEGATIVE 12/12/2020 0026   HGBUR NEGATIVE 12/12/2020 0026   BILIRUBINUR SMALL (A) 12/12/2020 0026   KETONESUR TRACE (A) 12/12/2020 0026   PROTEINUR NEGATIVE  12/12/2020 0026   NITRITE NEGATIVE 12/12/2020 0026   LEUKOCYTESUR NEGATIVE 12/12/2020 0026   Sepsis Labs Invalid input(s): PROCALCITONIN,  WBC,  LACTICIDVEN Microbiology Recent Results (from the past 240 hour(s))  Resp Panel by RT-PCR (Flu A&B, Covid) Nasopharyngeal Swab     Status: None   Collection Time: 12/11/20 10:27 PM   Specimen: Nasopharyngeal Swab; Nasopharyngeal(NP) swabs in vial transport medium  Result Value Ref Range Status   SARS Coronavirus 2 by RT PCR NEGATIVE NEGATIVE Final    Comment: (NOTE) SARS-CoV-2 target nucleic acids are NOT DETECTED.  The SARS-CoV-2 RNA is generally detectable in upper respiratory specimens during the acute phase of infection. The lowest concentration of SARS-CoV-2 viral copies this assay can detect is 138 copies/mL. A negative result does not preclude SARS-Cov-2 infection  and should not be used as the sole basis for treatment or other patient management decisions. A negative result may occur with  improper specimen collection/handling, submission of specimen other than nasopharyngeal swab, presence of viral mutation(s) within the areas targeted by this assay, and inadequate number of viral copies(<138 copies/mL). A negative result must be combined with clinical observations, patient history, and epidemiological information. The expected result is Negative.  Fact Sheet for Patients:  EntrepreneurPulse.com.au  Fact Sheet for Healthcare Providers:  IncredibleEmployment.be  This test is no t yet approved or cleared by the [place] FDA and  has been authorized for detection and/or diagnosis of SARS-CoV-2 by FDA under an Emergency Use Authorization (EUA). This EUA will remain  in effect (meaning this test can be used) for the duration of the COVID-19 declaration under Section 564(b)(1) of the Act, 21 U.S.C.section 360bbb-3(b)(1), unless the authorization is terminated  or revoked sooner.        Influenza A by PCR NEGATIVE NEGATIVE Final   Influenza B by PCR NEGATIVE NEGATIVE Final    Comment: (NOTE) The Xpert Xpress SARS-CoV-2/FLU/RSV plus assay is intended as an aid in the diagnosis of influenza from Nasopharyngeal swab specimens and should not be used as a sole basis for treatment. Nasal washings and aspirates are unacceptable for Xpert Xpress SARS-CoV-2/FLU/RSV testing.  Fact Sheet for Patients: EntrepreneurPulse.com.au  Fact Sheet for Healthcare Providers: IncredibleEmployment.be  This test is not yet approved or cleared by the [place] FDA and has been authorized for detection and/or diagnosis of SARS-CoV-2 by FDA under an Emergency Use Authorization (EUA). This EUA will remain in effect (meaning this test can be used) for the duration of the COVID-19 declaration under Section 564(b)(1) of the Act, 21 U.S.C. section 360bbb-3(b)(1), unless the authorization is terminated or revoked.  Performed at [facility], [place] [address], [place], [place] [address]      Time coordinating discharge: 39 minutes  SIGNED:   Georgette Shell, MD  [facility] 12/15/2020, 9:08 AM

## 2020-12-24 DIAGNOSIS — R627 Adult failure to thrive: Secondary | ICD-10-CM | POA: Diagnosis not present

## 2020-12-24 DIAGNOSIS — N289 Disorder of kidney and ureter, unspecified: Secondary | ICD-10-CM | POA: Diagnosis not present

## 2020-12-24 DIAGNOSIS — R634 Abnormal weight loss: Secondary | ICD-10-CM | POA: Diagnosis not present

## 2020-12-30 DIAGNOSIS — J45909 Unspecified asthma, uncomplicated: Secondary | ICD-10-CM | POA: Diagnosis not present

## 2020-12-30 DIAGNOSIS — R634 Abnormal weight loss: Secondary | ICD-10-CM | POA: Diagnosis not present

## 2020-12-30 DIAGNOSIS — E7800 Pure hypercholesterolemia, unspecified: Secondary | ICD-10-CM | POA: Diagnosis not present

## 2020-12-30 DIAGNOSIS — I10 Essential (primary) hypertension: Secondary | ICD-10-CM | POA: Diagnosis not present

## 2021-01-01 DIAGNOSIS — J45909 Unspecified asthma, uncomplicated: Secondary | ICD-10-CM | POA: Diagnosis not present

## 2021-01-26 ENCOUNTER — Encounter: Payer: Self-pay | Admitting: Neurology

## 2021-01-26 ENCOUNTER — Ambulatory Visit: Payer: Medicare Other | Admitting: Neurology

## 2021-01-26 VITALS — BP 150/78 | HR 108 | Ht 61.0 in | Wt 169.0 lb

## 2021-01-26 DIAGNOSIS — R7989 Other specified abnormal findings of blood chemistry: Secondary | ICD-10-CM | POA: Diagnosis not present

## 2021-01-26 DIAGNOSIS — R799 Abnormal finding of blood chemistry, unspecified: Secondary | ICD-10-CM | POA: Diagnosis not present

## 2021-01-26 DIAGNOSIS — E538 Deficiency of other specified B group vitamins: Secondary | ICD-10-CM | POA: Diagnosis not present

## 2021-01-26 DIAGNOSIS — G3184 Mild cognitive impairment, so stated: Secondary | ICD-10-CM | POA: Diagnosis not present

## 2021-01-26 NOTE — Progress Notes (Signed)
[facility]  PATIENT: Lindsay Bowen DOB: 1951-01-16  REFERRING CLINICIAN: Georgette Shell, MD HISTORY FROM: Patient and niece  REASON FOR VISIT: memory problems    HISTORICAL  CHIEF COMPLAINT:  Chief Complaint  Patient presents with   New Patient (Initial Visit)    Rm 97, with niece, states she is doing much better, and memory decline was due to depression after she lost her husband, states her memory is no longer an issue and she is doing much better on lexapro, mmse 21    HISTORY OF PRESENT ILLNESS:  This is a 70 year old woman with past medical history of depression, asthma, hypertension and hyperlipidemia who is presenting for memory decline.  Patient said that last year she lost her husband that she been married for more than 40 years and it has been very difficult for her.  She was not eating right, she was not taking care of herself, she lost weight about 40 pounds and about a month and a half ago she was admitted to the hospital for abnormal labs.  During that time she was also admitted to having severe depression since the death of her husband last 12/24/22.  She was also noted to have worsening memory.  On discharge she was started on Lexapro and was told to follow-up with neurology for her memory recurrence problem.  Patient said that since being discharged from the hospital and started Lexapro a month and a half ago she is doing much better, her memory improved, her mood improved and she is finding joy in life again.   She reported she lives at home, she is able to cook, clean, and take care of her house.  She is able to sleep all night long and she is paying her bills by herself.  Denies any visual or auditory hallucination.  She reported she is still socially very active in her church, seeing her niece and also she took her kids every day.    OTHER MEDICAL CONDITIONS: Depression, Asthma, HTN, HLD   REVIEW OF SYSTEMS: Full 14 system review of  systems performed and negative with exception of: as noted in the HPI  ALLERGIES: Allergies  Allergen Reactions   Red Dye Itching    HOME MEDICATIONS: Outpatient Medications Prior to Visit  Medication Sig Dispense Refill   amLODipine (NORVASC) 5 MG tablet Take 1 tablet (5 mg total) by mouth daily. 30 tablet 11   atorvastatin (LIPITOR) 40 MG tablet Take 40 mg by mouth daily.     escitalopram (LEXAPRO) 10 MG tablet Take 1 tablet (10 mg total) by mouth daily. 30 tablet 2   Fluticasone-Salmeterol (ADVAIR DISKUS) 100-50 MCG/DOSE AEPB Inhale 1 puff into the lungs 2 (two) times daily. Need office visit for additional refills. 60 each 0   No facility-administered medications prior to visit.    PAST MEDICAL HISTORY: Past Medical History:  Diagnosis Date   Asthma     PAST SURGICAL HISTORY: Past Surgical History:  Procedure Laterality Date   CHOLECYSTECTOMY      FAMILY HISTORY: Family History  Problem Relation Age of Onset   Kidney disease Neg Hx     SOCIAL HISTORY: Social History   Socioeconomic History   Marital status: Married    Spouse name: Not on file   Number of children: Not on file   Years of education: Not on file   Highest education level: Not on file  Occupational History   Not on file  Tobacco Use  Smoking status: Never   Smokeless tobacco: Never  Vaping Use   Vaping Use: Never used  Substance and Sexual Activity   Alcohol use: No   Drug use: No   Sexual activity: Not Currently  Other Topics Concern   Not on file  Social History Narrative   Not on file   Social Determinants of Health   Financial Resource Strain: Not on file  Food Insecurity: Not on file  Transportation Needs: Not on file  Physical Activity: Not on file  Stress: Not on file  Social Connections: Not on file  Intimate Partner Violence: Not on file     PHYSICAL EXAM  GENERAL EXAM/CONSTITUTIONAL: Vitals:  Vitals:   01/26/21 1324  BP: (!) 150/78  Pulse: (!) 108  Weight:  169 lb (76.7 kg)  Height: 5\' 1"  (1.549 m)   Body mass index is 31.93 kg/m. Wt Readings from Last 3 Encounters:  01/26/21 169 lb (76.7 kg)  12/11/20 152 lb (68.9 kg)  07/10/11 214 lb 3.2 oz (97.2 kg)   Patient is in no distress; well developed, nourished and groomed; neck is supple  EYES: Pupils round and reactive to light, Visual fields full to confrontation, Extraocular movements intacts,   MUSCULOSKELETAL: Gait, strength, tone, movements noted in Neurologic exam below  NEUROLOGIC: MENTAL STATUS:  MMSE - [place] Exam 01/26/2021  Orientation to time 5  Orientation to Place 5  Registration 3  Attention/ Calculation 0  Recall 1  Language- name 2 objects 2  Language- repeat 0  Language- follow 3 step command 3  Language- read & follow direction 1  Write a sentence 1  Copy design 0  Total score 21   awake, alert, oriented to person, place and time recent and remote memory intact normal attention and concentration language fluent, comprehension intact, naming intact fund of knowledge appropriate  CRANIAL NERVE:  2nd, 3rd, 4th, 6th - pupils equal and reactive to light, visual fields full to confrontation, extraocular muscles intact, no nystagmus 5th - facial sensation symmetric 7th - facial strength symmetric 8th - hearing intact 9th - palate elevates symmetrically, uvula midline 11th - shoulder shrug symmetric 12th - tongue protrusion midline  MOTOR:  normal bulk and tone, full strength in the BUE, BLE  SENSORY:  normal and symmetric to light touch, pinprick, temperature, vibration  COORDINATION:  finger-nose-finger, fine finger movements normal  REFLEXES:  deep tendon reflexes present and symmetric  GAIT/STATION:  normal    DIAGNOSTIC DATA (LABS, IMAGING, TESTING) - I reviewed patient records, labs, notes, testing and imaging myself where available.  Lab Results  Component Value Date   WBC 10.7 (H) 12/13/2020   HGB 12.3 12/13/2020   HCT  35.8 (L) 12/13/2020   MCV 84.8 12/13/2020   PLT 230 12/13/2020      Component Value Date/Time   NA 140 12/15/2020 0434   K 3.5 12/15/2020 0434   CL 110 12/15/2020 0434   CO2 22 12/15/2020 0434   GLUCOSE 72 12/15/2020 0434   BUN 29 (H) 12/15/2020 0434   CREATININE 1.86 (H) 12/15/2020 0434   CREATININE 1.06 07/10/2011 1622   CALCIUM 9.0 12/15/2020 0434   PROT 7.1 12/12/2020 0437   ALBUMIN 4.1 12/12/2020 0437   AST 15 12/12/2020 0437   ALT 10 12/12/2020 0437   ALKPHOS 43 12/12/2020 0437   BILITOT 1.9 (H) 12/12/2020 0437   GFRNONAA 29 (L) 12/15/2020 0434   No results found for: CHOL, HDL, LDLCALC, LDLDIRECT, TRIG, CHOLHDL No results found for:  HGBA1C No results found for: VITAMINB12 No results found for: TSH    ASSESSMENT AND PLAN  70 y.o. year old female with depression, asthma, hypertension and hyperlipidemia who is presenting for memory decline.  Patient stated last September she lost her husband of more than 40 years, and she has been very depressed, very sad.  Due to her lost she has not been eating well, drinking well, sleeping well and in fact she lost 40 pounds.  She was recently admitted to the hospital for abnormal lab and at that time was found to have depression and memory decline.  She was started on Lexapro and since discharge reported her mood has improved, she is feeling much better, her sleep improved and she is having more social interaction, going to church and hanging out with family and friends.  Since starting the medication also her memory has improved she is not as forgetful as before but there is signs of mild cognitive impairment noted on exam, she scored a 21 out of 30 on the Mini-Mental status evaluation.  She only has been taking the medication for a month and a half, I will reevaluate her in 21-month and see if there is any improvement, if not or if there is worsening of her memory, we will have a discussion about starting Aricept.  I will also obtain a TSH  and a B12 level today.  Follow-up in 52-month   1. Mild cognitive impairment     PLAN: TSH  Vitamin B 12  Return in 6 months    Orders Placed This Encounter  Procedures   TSH   Vitamin B12    No orders of the defined types were placed in this encounter.   Return in about 6 months (around 07/27/2021).    Alric Ran, MD 01/26/2021, 10:04 PM  [facility] [address] [place], [place] [address] [phone]

## 2021-01-26 NOTE — Patient Instructions (Signed)
TSH  Vitamin B 12  Return in 6 months    There are well-accepted and sensible ways to reduce risk for Alzheimers disease and other degenerative brain disorders .  Exercise Daily Walk A daily 20 minute walk should be part of your routine. Disease related apathy can be a significant roadblock to exercise and the only way to overcome this is to make it a daily routine and perhaps have a reward at the end (something your loved one loves to eat or drink perhaps) or a personal trainer coming to the home can also be very useful. Most importantly, the patient is much more likely to exercise if the caregiver / spouse does it with him/her. In general a structured, repetitive schedule is best.  General Health: Any diseases which effect your body will effect your brain such as a pneumonia, urinary infection, blood clot, heart attack or stroke. Keep contact with your primary care doctor for regular follow ups.  Sleep. A good nights sleep is healthy for the brain. Seven hours is recommended. If you have insomnia or poor sleep habits we can give you some instructions. If you have sleep apnea wear your mask.  Diet: Eating a heart healthy diet is also a good idea; fish and poultry instead of red meat, nuts (mostly non-peanuts), vegetables, fruits, olive oil or canola oil (instead of butter), minimal salt (use other spices to flavor foods), whole grain rice, bread, cereal and pasta and wine in moderation.Research is now showing that the MIND diet, which is a combination of The Mediterranean diet and the DASH diet, is beneficial for cognitive processing and longevity. Information about this diet can be found in The MIND Diet, a book by Doyne Keel, MS, RDN, and online at [employer]book[employer].si  Finances, Power of Attorney and Advance Directives: You should consider putting legal safeguards in place with regard to financial and medical decision making. While the spouse always has power of attorney  for medical and financial issues in the absence of any form, you should consider what you want in case the spouse / caregiver is no longer around or capable of making decisions.      Heart-head connection  New research shows there are things we can do to reduce the risk of mild cognitive impairment and dementia.  Several conditions known to increase the risk of cardiovascular disease -- such as high blood pressure, diabetes and high cholesterol -- also increase the risk of developing Alzheimer's. Some autopsy studies show that as many as 42 percent of individuals with Alzheimer's disease also have cardiovascular disease.  A longstanding question is why some people develop hallmark Alzheimer's plaques and tangles but do not develop the symptoms of Alzheimer's. Vascular disease may help researchers eventually find an answer. Some autopsy studies suggest that plaques and tangles may be present in the brain without causing symptoms of cognitive decline unless the brain also shows evidence of vascular disease. More research is needed to better understand the link between vascular health and Alzheimer's.  Physical exercise and diet Regular physical exercise may be a beneficial strategy to lower the risk of Alzheimer's and vascular dementia. Exercise may directly benefit brain cells by increasing blood and oxygen flow in the brain. Because of its known cardiovascular benefits, a medically approved exercise program is a valuable part of any overall wellness plan.  Current evidence suggests that heart-healthy eating may also help protect the brain. Heart-healthy eating includes limiting the intake of sugar and saturated fats and making sure to eat  plenty of fruits, vegetables, and whole grains. No one diet is best. Two diets that have been studied and may be beneficial are the DASH (Dietary Approaches to Stop Hypertension) diet and the Mediterranean diet. The DASH diet emphasizes vegetables, fruits and  fat-free or low-fat dairy products; includes whole grains, fish, poultry, beans, seeds, nuts and vegetable oils; and limits sodium, sweets, sugary beverages and red meats. A Mediterranean diet includes relatively little red meat and emphasizes whole grains, fruits and vegetables, fish and shellfish, and nuts, olive oil and other healthy fats.  Social connections and intellectual activity A number of studies indicate that maintaining strong social connections and keeping mentally active as we age might lower the risk of cognitive decline and Alzheimer's. Experts are not certain about the reason for this association. It may be due to direct mechanisms through which social and mental stimulation strengthen connections between nerve cells in the brain.  Head trauma There appears to be a strong link between future risk of Alzheimer's and serious head trauma, especially when injury involves loss of consciousness. You can help reduce your risk of Alzheimer's by protecting your head.  Wear a seat belt  Use a helmet when participating in sports  "Fall-proof" your home   What you can do now While research is not yet conclusive, certain lifestyle choices, such as physical activity and diet, may help support brain health and prevent Alzheimer's. Many of these lifestyle changes have been shown to lower the risk of other diseases, like heart disease and diabetes, which have been linked to Alzheimer's. With few drawbacks and plenty of known benefits, healthy lifestyle choices can improve your health and possibly protect your brain.  Learn more about brain health. You can help increase our knowledge by considering participation in a clinical study. Our free clinical trial matching services, TrialMatch, can help you find clinical trials in your area that are seeking volunteers.

## 2021-01-27 ENCOUNTER — Telehealth: Payer: Self-pay | Admitting: Neurology

## 2021-01-27 LAB — VITAMIN B12: Vitamin B-12: 235 pg/mL (ref 232–1245)

## 2021-01-27 LAB — TSH: TSH: 1.15 u[IU]/mL (ref 0.450–4.500)

## 2021-01-27 MED ORDER — VITAMIN B-12 1000 MCG PO TABS: 1000.0000 ug | ORAL_TABLET | Freq: Every day | ORAL | 2 refills | Status: DC

## 2021-01-27 NOTE — Telephone Encounter (Signed)
Spoke with patient, inform her that the B12 was low normal at 235, I will send a B12 supplement to take daily.  She is comfortable with plan.  Solon Alban

## 2021-01-31 DIAGNOSIS — J45909 Unspecified asthma, uncomplicated: Secondary | ICD-10-CM | POA: Diagnosis not present

## 2021-02-08 DIAGNOSIS — R627 Adult failure to thrive: Secondary | ICD-10-CM | POA: Diagnosis not present

## 2021-02-08 DIAGNOSIS — N289 Disorder of kidney and ureter, unspecified: Secondary | ICD-10-CM | POA: Diagnosis not present

## 2021-02-08 DIAGNOSIS — G3184 Mild cognitive impairment, so stated: Secondary | ICD-10-CM | POA: Diagnosis not present

## 2021-02-08 DIAGNOSIS — I129 Hypertensive chronic kidney disease with stage 1 through stage 4 chronic kidney disease, or unspecified chronic kidney disease: Secondary | ICD-10-CM | POA: Diagnosis not present

## 2021-02-23 DIAGNOSIS — E7800 Pure hypercholesterolemia, unspecified: Secondary | ICD-10-CM | POA: Diagnosis not present

## 2021-02-23 DIAGNOSIS — I129 Hypertensive chronic kidney disease with stage 1 through stage 4 chronic kidney disease, or unspecified chronic kidney disease: Secondary | ICD-10-CM | POA: Diagnosis not present

## 2021-02-23 DIAGNOSIS — J45909 Unspecified asthma, uncomplicated: Secondary | ICD-10-CM | POA: Diagnosis not present

## 2021-02-23 DIAGNOSIS — N1832 Chronic kidney disease, stage 3b: Secondary | ICD-10-CM | POA: Diagnosis not present

## 2021-03-03 DIAGNOSIS — J45909 Unspecified asthma, uncomplicated: Secondary | ICD-10-CM | POA: Diagnosis not present

## 2021-03-25 DIAGNOSIS — J45909 Unspecified asthma, uncomplicated: Secondary | ICD-10-CM | POA: Diagnosis not present

## 2021-03-25 DIAGNOSIS — E7800 Pure hypercholesterolemia, unspecified: Secondary | ICD-10-CM | POA: Diagnosis not present

## 2021-03-25 DIAGNOSIS — N1832 Chronic kidney disease, stage 3b: Secondary | ICD-10-CM | POA: Diagnosis not present

## 2021-03-25 DIAGNOSIS — I129 Hypertensive chronic kidney disease with stage 1 through stage 4 chronic kidney disease, or unspecified chronic kidney disease: Secondary | ICD-10-CM | POA: Diagnosis not present

## 2021-03-26 DIAGNOSIS — R1013 Epigastric pain: Secondary | ICD-10-CM | POA: Diagnosis not present

## 2021-03-26 DIAGNOSIS — K5730 Diverticulosis of large intestine without perforation or abscess without bleeding: Secondary | ICD-10-CM | POA: Diagnosis not present

## 2021-03-26 DIAGNOSIS — K2282 Esophagogastric junction polyp: Secondary | ICD-10-CM | POA: Diagnosis not present

## 2021-03-26 DIAGNOSIS — D123 Benign neoplasm of transverse colon: Secondary | ICD-10-CM | POA: Diagnosis not present

## 2021-03-26 DIAGNOSIS — Z1211 Encounter for screening for malignant neoplasm of colon: Secondary | ICD-10-CM | POA: Diagnosis not present

## 2021-03-26 DIAGNOSIS — K317 Polyp of stomach and duodenum: Secondary | ICD-10-CM | POA: Diagnosis not present

## 2021-04-01 ENCOUNTER — Ambulatory Visit
Admission: RE | Admit: 2021-04-01 | Discharge: 2021-04-01 | Disposition: A | Discharge status: Home / Self Care | Payer: Medicare Other | Source: Ambulatory Visit | Attending: Family Medicine | Admitting: Family Medicine

## 2021-04-01 DIAGNOSIS — D123 Benign neoplasm of transverse colon: Secondary | ICD-10-CM | POA: Diagnosis not present

## 2021-04-01 DIAGNOSIS — K317 Polyp of stomach and duodenum: Secondary | ICD-10-CM | POA: Diagnosis not present

## 2021-04-01 DIAGNOSIS — E2839 Other primary ovarian failure: Secondary | ICD-10-CM

## 2021-04-01 DIAGNOSIS — Z780 Asymptomatic menopausal state: Secondary | ICD-10-CM | POA: Diagnosis not present

## 2021-04-01 DIAGNOSIS — Z1231 Encounter for screening mammogram for malignant neoplasm of breast: Secondary | ICD-10-CM

## 2021-04-02 DIAGNOSIS — J45909 Unspecified asthma, uncomplicated: Secondary | ICD-10-CM | POA: Diagnosis not present

## 2021-04-06 ENCOUNTER — Other Ambulatory Visit: Payer: Self-pay | Admitting: Family Medicine

## 2021-04-06 DIAGNOSIS — R928 Other abnormal and inconclusive findings on diagnostic imaging of breast: Secondary | ICD-10-CM

## 2021-04-28 DIAGNOSIS — N1832 Chronic kidney disease, stage 3b: Secondary | ICD-10-CM | POA: Diagnosis not present

## 2021-04-28 DIAGNOSIS — J45909 Unspecified asthma, uncomplicated: Secondary | ICD-10-CM | POA: Diagnosis not present

## 2021-04-28 DIAGNOSIS — I129 Hypertensive chronic kidney disease with stage 1 through stage 4 chronic kidney disease, or unspecified chronic kidney disease: Secondary | ICD-10-CM | POA: Diagnosis not present

## 2021-04-28 DIAGNOSIS — E7800 Pure hypercholesterolemia, unspecified: Secondary | ICD-10-CM | POA: Diagnosis not present

## 2021-05-03 DIAGNOSIS — J45909 Unspecified asthma, uncomplicated: Secondary | ICD-10-CM | POA: Diagnosis not present

## 2021-05-05 ENCOUNTER — Ambulatory Visit
Admission: RE | Admit: 2021-05-05 | Discharge: 2021-05-05 | Disposition: A | Discharge status: Home / Self Care | Payer: Medicare Other | Source: Ambulatory Visit | Attending: Family Medicine | Admitting: Family Medicine

## 2021-05-05 DIAGNOSIS — R928 Other abnormal and inconclusive findings on diagnostic imaging of breast: Secondary | ICD-10-CM

## 2021-05-05 DIAGNOSIS — N6002 Solitary cyst of left breast: Secondary | ICD-10-CM | POA: Diagnosis not present

## 2021-05-25 DIAGNOSIS — N1832 Chronic kidney disease, stage 3b: Secondary | ICD-10-CM | POA: Diagnosis not present

## 2021-05-25 DIAGNOSIS — E7800 Pure hypercholesterolemia, unspecified: Secondary | ICD-10-CM | POA: Diagnosis not present

## 2021-05-25 DIAGNOSIS — I129 Hypertensive chronic kidney disease with stage 1 through stage 4 chronic kidney disease, or unspecified chronic kidney disease: Secondary | ICD-10-CM | POA: Diagnosis not present

## 2021-05-25 DIAGNOSIS — J45909 Unspecified asthma, uncomplicated: Secondary | ICD-10-CM | POA: Diagnosis not present

## 2021-06-01 DIAGNOSIS — J45909 Unspecified asthma, uncomplicated: Secondary | ICD-10-CM | POA: Diagnosis not present

## 2021-06-28 DIAGNOSIS — N1832 Chronic kidney disease, stage 3b: Secondary | ICD-10-CM | POA: Diagnosis not present

## 2021-06-28 DIAGNOSIS — E7800 Pure hypercholesterolemia, unspecified: Secondary | ICD-10-CM | POA: Diagnosis not present

## 2021-06-28 DIAGNOSIS — I10 Essential (primary) hypertension: Secondary | ICD-10-CM | POA: Diagnosis not present

## 2021-07-01 DIAGNOSIS — J45909 Unspecified asthma, uncomplicated: Secondary | ICD-10-CM | POA: Diagnosis not present

## 2021-07-22 ENCOUNTER — Ambulatory Visit: Payer: Medicare Other | Admitting: Neurology

## 2021-07-22 VITALS — BP 179/74 | HR 101 | Ht 61.0 in | Wt 193.0 lb

## 2021-07-22 DIAGNOSIS — G3184 Mild cognitive impairment, so stated: Secondary | ICD-10-CM

## 2021-07-22 DIAGNOSIS — F32A Depression, unspecified: Secondary | ICD-10-CM | POA: Diagnosis not present

## 2021-07-22 NOTE — Patient Instructions (Signed)
Continue current medications  ?Start one a day multivitamin  ?Return as needed  ?

## 2021-07-22 NOTE — Progress Notes (Signed)
? ?[facility] ? ?PATIENT: Lindsay Bowen ?DOB: Mar 18, 1951 ? ?REFERRING CLINICIAN: Kathyrn Lass, MD ?HISTORY FROM: Patient and niece  ?REASON FOR VISIT: memory problems  ? ? ?HISTORICAL ? ?CHIEF COMPLAINT:  ?Chief Complaint  ?Patient presents with  ? Follow-up  ?  Rm 15, alone, states she is doing well, no concerns   ? ?INTERVAL HISTORY 07/22/2021 ?Patient presents today for follow up. At last visit, I have advised her to continue with Lexapro for her depression and to re-evaluate for memory. Today she reports that she feels much better, she is improving. Her depressive symptoms are better and her memory is better also. Niece who accompanied her today also confirm that she is much better. She does not have any concerns at the moment.  ? ? ? ?HISTORY OF PRESENT ILLNESS:  ?This is a 71 year old woman with past medical history of depression, asthma, hypertension and hyperlipidemia who is presenting for memory decline.  Patient said that last year she lost her husband that she been married for more than 40 years and it has been very difficult for her.  She was not eating right, she was not taking care of herself, she lost weight about 40 pounds and about a month and a half ago she was admitted to the hospital for abnormal labs.  During that time she was also admitted to having severe depression since the death of her husband last 12/27/22.  She was also noted to have worsening memory.  On discharge she was started on Lexapro and was told to follow-up with neurology for her memory recurrence problem.  Patient said that since being discharged from the hospital and started Lexapro a month and a half ago she is doing much better, her memory improved, her mood improved and she is finding joy in life again.   ?She reported she lives at home, she is able to cook, clean, and take care of her house.  She is able to sleep all night long and she is paying her bills by herself.  Denies any visual or auditory  hallucination.  She reported she is still socially very active in her church, seeing her niece and also she took her kids every day. ? ? ? ?OTHER MEDICAL CONDITIONS: Depression, Asthma, HTN, HLD ? ? ?REVIEW OF SYSTEMS: Full 14 system review of systems performed and negative with exception of: as noted in the HPI ? ?ALLERGIES: ?Allergies  ?Allergen Reactions  ? Red Dye Itching  ? ? ?HOME MEDICATIONS: ?Outpatient Medications Prior to Visit  ?Medication Sig Dispense Refill  ? amLODipine (NORVASC) 5 MG tablet Take 1 tablet (5 mg total) by mouth daily. 30 tablet 11  ? atorvastatin (LIPITOR) 40 MG tablet Take 40 mg by mouth daily.    ? escitalopram (LEXAPRO) 10 MG tablet Take 1 tablet (10 mg total) by mouth daily. 30 tablet 2  ? Fluticasone-Salmeterol (ADVAIR DISKUS) 100-50 MCG/DOSE AEPB Inhale 1 puff into the lungs 2 (two) times daily. Need office visit for additional refills. 60 each 0  ? ?No facility-administered medications prior to visit.  ? ? ?PAST MEDICAL HISTORY: ?Past Medical History:  ?Diagnosis Date  ? Asthma   ? ? ?PAST SURGICAL HISTORY: ?Past Surgical History:  ?Procedure Laterality Date  ? CHOLECYSTECTOMY    ? ? ?FAMILY HISTORY: ?Family History  ?Problem Relation Age of Onset  ? Kidney disease Neg Hx   ? ? ?SOCIAL HISTORY: ?Social History  ? ?Socioeconomic History  ? Marital status: Married  ?  Spouse  name: Not on file  ? Number of children: Not on file  ? Years of education: Not on file  ? Highest education level: Not on file  ?Occupational History  ? Not on file  ?Tobacco Use  ? Smoking status: Never  ? Smokeless tobacco: Never  ?Vaping Use  ? Vaping Use: Never used  ?Substance and Sexual Activity  ? Alcohol use: No  ? Drug use: No  ? Sexual activity: Not Currently  ?Other Topics Concern  ? Not on file  ?Social History Narrative  ? Not on file  ? ?Social Determinants of Health  ? ?Financial Resource Strain: Not on file  ?Food Insecurity: Not on file  ?Transportation Needs: Not on file  ?Physical Activity:  Not on file  ?Stress: Not on file  ?Social Connections: Not on file  ?Intimate Partner Violence: Not on file  ? ? ? ?PHYSICAL EXAM ? ?GENERAL EXAM/CONSTITUTIONAL: ?Vitals:  ?Vitals:  ? 07/22/21 1451  ?BP: (!) 179/74  ?Pulse: (!) 101  ?Weight: 193 lb (87.5 kg)  ?Height: '5\' 1"'$  (1.549 m)  ? ?Body mass index is 36.47 kg/m?. ?Wt Readings from Last 3 Encounters:  ?07/22/21 193 lb (87.5 kg)  ?01/26/21 169 lb (76.7 kg)  ?12/11/20 152 lb (68.9 kg)  ? ?Patient is in no distress; well developed, nourished and groomed; neck is supple ? ?EYES: ?Pupils round and reactive to light, Visual fields full to confrontation, Extraocular movements intacts,  ? ?MUSCULOSKELETAL: ?Gait, strength, tone, movements noted in Neurologic exam below ? ?NEUROLOGIC: ?MENTAL STATUS:  ? ?  01/26/2021  ?  1:27 PM  ?MMSE - Mini Mental State Exam  ?Orientation to time 5  ?Orientation to Place 5  ?Registration 3  ?Attention/ Calculation 0  ?Recall 1  ?Language- name 2 objects 2  ?Language- repeat 0  ?Language- follow 3 step command 3  ?Language- read & follow direction 1  ?Write a sentence 1  ?Copy design 0  ?Total score 21  ? ?awake, alert, oriented to person, place and time ?recent and remote memory intact ?normal attention and concentration ?language fluent, comprehension intact, naming intact ?fund of knowledge appropriate ? ?CRANIAL NERVE:  ?2nd, 3rd, 4th, 6th - pupils equal and reactive to light, visual fields full to confrontation, extraocular muscles intact, no nystagmus ?5th - facial sensation symmetric ?7th - facial strength symmetric ?8th - hearing intact ?9th - palate elevates symmetrically, uvula midline ?11th - shoulder shrug symmetric ?12th - tongue protrusion midline ? ?MOTOR:  ?normal bulk and tone, full strength in the BUE, BLE ? ?SENSORY:  ?normal and symmetric to light touch, pinprick, temperature, vibration ? ?COORDINATION:  ?finger-nose-finger, fine finger movements normal ? ?REFLEXES:  ?deep tendon reflexes present and  symmetric ? ?GAIT/STATION:  ?normal ? ? ? ?DIAGNOSTIC DATA (LABS, IMAGING, TESTING) ?- I reviewed patient records, labs, notes, testing and imaging myself where available. ? ?Lab Results  ?Component Value Date  ? WBC 10.7 (H) 12/13/2020  ? HGB 12.3 12/13/2020  ? HCT 35.8 (L) 12/13/2020  ? MCV 84.8 12/13/2020  ? PLT 230 12/13/2020  ? ?   ?Component Value Date/Time  ? NA 140 12/15/2020 0434  ? K 3.5 12/15/2020 0434  ? CL 110 12/15/2020 0434  ? CO2 22 12/15/2020 0434  ? GLUCOSE 72 12/15/2020 0434  ? BUN 29 (H) 12/15/2020 0434  ? CREATININE 1.86 (H) 12/15/2020 0434  ? CREATININE 1.06 07/10/2011 1622  ? CALCIUM 9.0 12/15/2020 0434  ? PROT 7.1 12/12/2020 0437  ? ALBUMIN 4.1 12/12/2020 0437  ?  AST 15 12/12/2020 0437  ? ALT 10 12/12/2020 0437  ? ALKPHOS 43 12/12/2020 0437  ? BILITOT 1.9 (H) 12/12/2020 0437  ? GFRNONAA 29 (L) 12/15/2020 0434  ? ?No results found for: CHOL, HDL, LDLCALC, LDLDIRECT, TRIG, CHOLHDL ?No results found for: HGBA1C ?Lab Results  ?Component Value Date  ? [id number] 235 01/26/2021  ? ?Lab Results  ?Component Value Date  ? TSH 1.150 01/26/2021  ? ? ?ASSESSMENT AND PLAN ? ?71 y.o. year old female with depression, asthma, hypertension and hyperlipidemia who is presenting for memory decline.  Patient stated last September she lost her husband of more than 40 years, and she has been very depressed, very sad.  Due to her lost she has not been eating well, drinking well, sleeping well and in fact she lost 40 pounds.  She was recently admitted to the hospital for abnormal lab and at that time was found to have depression and memory decline.  She was started on Lexapro and since discharge reported her mood has improved, she is feeling much better, her sleep improved and she is having more social interaction, going to church and hanging out with family and friends.  Since starting the medication also her memory has improved she her depression symptoms are better managed. At this point, I will hold on starting  Aricept, will have patient continue on Lexapro and continue with therapy. Her B12 was 235, I will suggest a daily multivitamin.  ?Continue to follow up with PCP and return as needed ? ? ? ?1. Mild cognitive impairment

## 2021-07-25 ENCOUNTER — Encounter: Payer: Self-pay | Admitting: Neurology

## 2021-07-27 ENCOUNTER — Ambulatory Visit: Payer: Medicare Other | Admitting: Neurology

## 2021-08-09 DIAGNOSIS — J45909 Unspecified asthma, uncomplicated: Secondary | ICD-10-CM | POA: Diagnosis not present

## 2021-08-09 DIAGNOSIS — I129 Hypertensive chronic kidney disease with stage 1 through stage 4 chronic kidney disease, or unspecified chronic kidney disease: Secondary | ICD-10-CM | POA: Diagnosis not present

## 2021-08-09 DIAGNOSIS — E7800 Pure hypercholesterolemia, unspecified: Secondary | ICD-10-CM | POA: Diagnosis not present

## 2021-08-09 DIAGNOSIS — N1832 Chronic kidney disease, stage 3b: Secondary | ICD-10-CM | POA: Diagnosis not present

## 2021-08-13 DIAGNOSIS — H524 Presbyopia: Secondary | ICD-10-CM | POA: Diagnosis not present

## 2021-08-13 DIAGNOSIS — H2513 Age-related nuclear cataract, bilateral: Secondary | ICD-10-CM | POA: Diagnosis not present

## 2021-08-13 DIAGNOSIS — H52221 Regular astigmatism, right eye: Secondary | ICD-10-CM | POA: Diagnosis not present

## 2021-08-13 DIAGNOSIS — H5201 Hypermetropia, right eye: Secondary | ICD-10-CM | POA: Diagnosis not present

## 2021-09-17 DIAGNOSIS — N1832 Chronic kidney disease, stage 3b: Secondary | ICD-10-CM | POA: Diagnosis not present

## 2021-09-19 ENCOUNTER — Other Ambulatory Visit: Payer: Self-pay | Admitting: Neurology

## 2021-09-23 DIAGNOSIS — Z0000 Encounter for general adult medical examination without abnormal findings: Secondary | ICD-10-CM | POA: Diagnosis not present

## 2021-10-04 DIAGNOSIS — I10 Essential (primary) hypertension: Secondary | ICD-10-CM | POA: Diagnosis not present

## 2021-10-04 DIAGNOSIS — K317 Polyp of stomach and duodenum: Secondary | ICD-10-CM | POA: Diagnosis not present

## 2021-10-04 DIAGNOSIS — R1013 Epigastric pain: Secondary | ICD-10-CM | POA: Diagnosis not present

## 2021-10-19 DIAGNOSIS — Z79899 Other long term (current) drug therapy: Secondary | ICD-10-CM | POA: Diagnosis not present

## 2021-10-19 DIAGNOSIS — N1832 Chronic kidney disease, stage 3b: Secondary | ICD-10-CM | POA: Diagnosis not present

## 2021-10-19 DIAGNOSIS — J45909 Unspecified asthma, uncomplicated: Secondary | ICD-10-CM | POA: Diagnosis not present

## 2021-10-19 DIAGNOSIS — I129 Hypertensive chronic kidney disease with stage 1 through stage 4 chronic kidney disease, or unspecified chronic kidney disease: Secondary | ICD-10-CM | POA: Diagnosis not present

## 2021-10-29 ENCOUNTER — Other Ambulatory Visit: Payer: Self-pay | Admitting: Neurology

## 2021-11-22 ENCOUNTER — Other Ambulatory Visit: Payer: Self-pay | Admitting: Neurology

## 2021-11-22 DIAGNOSIS — I129 Hypertensive chronic kidney disease with stage 1 through stage 4 chronic kidney disease, or unspecified chronic kidney disease: Secondary | ICD-10-CM | POA: Diagnosis not present

## 2021-12-20 ENCOUNTER — Other Ambulatory Visit: Payer: Self-pay | Admitting: Neurology

## 2022-01-17 DIAGNOSIS — N1832 Chronic kidney disease, stage 3b: Secondary | ICD-10-CM | POA: Diagnosis not present

## 2022-01-17 DIAGNOSIS — I129 Hypertensive chronic kidney disease with stage 1 through stage 4 chronic kidney disease, or unspecified chronic kidney disease: Secondary | ICD-10-CM | POA: Diagnosis not present

## 2022-01-17 DIAGNOSIS — Z23 Encounter for immunization: Secondary | ICD-10-CM | POA: Diagnosis not present

## 2022-02-01 DIAGNOSIS — J45901 Unspecified asthma with (acute) exacerbation: Secondary | ICD-10-CM | POA: Diagnosis not present

## 2022-02-03 DIAGNOSIS — J45901 Unspecified asthma with (acute) exacerbation: Secondary | ICD-10-CM | POA: Diagnosis not present

## 2022-02-03 DIAGNOSIS — R030 Elevated blood-pressure reading, without diagnosis of hypertension: Secondary | ICD-10-CM | POA: Diagnosis not present

## 2022-02-07 DIAGNOSIS — I129 Hypertensive chronic kidney disease with stage 1 through stage 4 chronic kidney disease, or unspecified chronic kidney disease: Secondary | ICD-10-CM | POA: Diagnosis not present

## 2022-02-07 DIAGNOSIS — J45901 Unspecified asthma with (acute) exacerbation: Secondary | ICD-10-CM | POA: Diagnosis not present

## 2022-02-09 DIAGNOSIS — J45909 Unspecified asthma, uncomplicated: Secondary | ICD-10-CM | POA: Diagnosis not present

## 2022-02-09 DIAGNOSIS — I129 Hypertensive chronic kidney disease with stage 1 through stage 4 chronic kidney disease, or unspecified chronic kidney disease: Secondary | ICD-10-CM | POA: Diagnosis not present

## 2022-02-09 DIAGNOSIS — G3184 Mild cognitive impairment, so stated: Secondary | ICD-10-CM | POA: Diagnosis not present

## 2022-02-09 DIAGNOSIS — N1832 Chronic kidney disease, stage 3b: Secondary | ICD-10-CM | POA: Diagnosis not present

## 2022-02-22 DIAGNOSIS — D72829 Elevated white blood cell count, unspecified: Secondary | ICD-10-CM | POA: Diagnosis not present

## 2022-03-25 DIAGNOSIS — D72829 Elevated white blood cell count, unspecified: Secondary | ICD-10-CM | POA: Diagnosis not present

## 2022-05-13 DIAGNOSIS — N1832 Chronic kidney disease, stage 3b: Secondary | ICD-10-CM | POA: Diagnosis not present

## 2022-05-13 DIAGNOSIS — E7800 Pure hypercholesterolemia, unspecified: Secondary | ICD-10-CM | POA: Diagnosis not present

## 2022-05-13 DIAGNOSIS — I129 Hypertensive chronic kidney disease with stage 1 through stage 4 chronic kidney disease, or unspecified chronic kidney disease: Secondary | ICD-10-CM | POA: Diagnosis not present

## 2022-05-13 DIAGNOSIS — D72829 Elevated white blood cell count, unspecified: Secondary | ICD-10-CM | POA: Diagnosis not present

## 2022-05-13 DIAGNOSIS — J4530 Mild persistent asthma, uncomplicated: Secondary | ICD-10-CM | POA: Diagnosis not present

## 2022-08-19 DIAGNOSIS — I129 Hypertensive chronic kidney disease with stage 1 through stage 4 chronic kidney disease, or unspecified chronic kidney disease: Secondary | ICD-10-CM | POA: Diagnosis not present

## 2022-08-19 DIAGNOSIS — N1832 Chronic kidney disease, stage 3b: Secondary | ICD-10-CM | POA: Diagnosis not present

## 2022-08-19 DIAGNOSIS — F03B0 Unspecified dementia, moderate, without behavioral disturbance, psychotic disturbance, mood disturbance, and anxiety: Secondary | ICD-10-CM | POA: Diagnosis not present

## 2022-08-19 DIAGNOSIS — E7800 Pure hypercholesterolemia, unspecified: Secondary | ICD-10-CM | POA: Diagnosis not present

## 2022-09-11 ENCOUNTER — Other Ambulatory Visit: Payer: Self-pay | Admitting: Neurology

## 2022-09-16 ENCOUNTER — Other Ambulatory Visit: Payer: Self-pay | Admitting: Neurology

## 2022-09-20 ENCOUNTER — Other Ambulatory Visit: Payer: Self-pay | Admitting: Neurology

## 2022-09-27 DIAGNOSIS — Z0000 Encounter for general adult medical examination without abnormal findings: Secondary | ICD-10-CM | POA: Diagnosis not present

## 2022-09-28 ENCOUNTER — Other Ambulatory Visit: Payer: Self-pay | Admitting: Family Medicine

## 2022-09-28 DIAGNOSIS — Z1239 Encounter for other screening for malignant neoplasm of breast: Secondary | ICD-10-CM

## 2022-10-26 ENCOUNTER — Ambulatory Visit: Payer: Medicare Other

## 2022-10-26 DIAGNOSIS — Z1239 Encounter for other screening for malignant neoplasm of breast: Secondary | ICD-10-CM

## 2022-10-26 DIAGNOSIS — Z1231 Encounter for screening mammogram for malignant neoplasm of breast: Secondary | ICD-10-CM | POA: Diagnosis not present

## 2022-11-01 DIAGNOSIS — J988 Other specified respiratory disorders: Secondary | ICD-10-CM | POA: Diagnosis not present

## 2022-11-01 DIAGNOSIS — I10 Essential (primary) hypertension: Secondary | ICD-10-CM | POA: Diagnosis not present

## 2022-11-23 ENCOUNTER — Emergency Department (HOSPITAL_COMMUNITY)
Admission: EM | Admit: 2022-11-23 | Discharge: 2022-11-24 | Disposition: A | Discharge status: Home / Self Care | Payer: Medicare Other | Attending: Emergency Medicine | Admitting: Emergency Medicine

## 2022-11-23 ENCOUNTER — Other Ambulatory Visit: Payer: Self-pay

## 2022-11-23 ENCOUNTER — Emergency Department (HOSPITAL_COMMUNITY): Payer: Medicare Other

## 2022-11-23 DIAGNOSIS — Z20822 Contact with and (suspected) exposure to covid-19: Secondary | ICD-10-CM | POA: Insufficient documentation

## 2022-11-23 DIAGNOSIS — Z7951 Long term (current) use of inhaled steroids: Secondary | ICD-10-CM | POA: Diagnosis not present

## 2022-11-23 DIAGNOSIS — J9811 Atelectasis: Secondary | ICD-10-CM | POA: Diagnosis not present

## 2022-11-23 DIAGNOSIS — R058 Other specified cough: Secondary | ICD-10-CM | POA: Diagnosis not present

## 2022-11-23 DIAGNOSIS — J4521 Mild intermittent asthma with (acute) exacerbation: Secondary | ICD-10-CM | POA: Insufficient documentation

## 2022-11-23 DIAGNOSIS — R0602 Shortness of breath: Secondary | ICD-10-CM | POA: Diagnosis not present

## 2022-11-23 LAB — CBC WITH DIFFERENTIAL/PLATELET
Abs Immature Granulocytes: 0.07 10*3/uL (ref 0.00–0.07)
Basophils Absolute: 0 10*3/uL (ref 0.0–0.1)
Basophils Relative: 0 %
Eosinophils Absolute: 0.1 10*3/uL (ref 0.0–0.5)
Eosinophils Relative: 1 %
HCT: 41.8 % (ref 36.0–46.0)
Hemoglobin: 13.9 g/dL (ref 12.0–15.0)
Immature Granulocytes: 1 %
Lymphocytes Relative: 18 %
Lymphs Abs: 2.6 10*3/uL (ref 0.7–4.0)
MCH: 27.5 pg (ref 26.0–34.0)
MCHC: 33.3 g/dL (ref 30.0–36.0)
MCV: 82.8 fL (ref 80.0–100.0)
Monocytes Absolute: 1.1 10*3/uL — ABNORMAL HIGH (ref 0.1–1.0)
Monocytes Relative: 8 %
Neutro Abs: 10.7 10*3/uL — ABNORMAL HIGH (ref 1.7–7.7)
Neutrophils Relative %: 72 %
Platelets: 323 10*3/uL (ref 150–400)
RBC: 5.05 MIL/uL (ref 3.87–5.11)
RDW: 14.6 % (ref 11.5–15.5)
WBC: 14.6 10*3/uL — ABNORMAL HIGH (ref 4.0–10.5)
nRBC: 0 % (ref 0.0–0.2)

## 2022-11-23 MED ORDER — ALBUTEROL SULFATE (2.5 MG/3ML) 0.083% IN NEBU
2.5000 mg | INHALATION_SOLUTION | Freq: Once | RESPIRATORY_TRACT | Status: AC
  Administered 2022-11-23: 2.5 mg via RESPIRATORY_TRACT
  Filled 2022-11-23: qty 3

## 2022-11-23 NOTE — ED Triage Notes (Signed)
Patient reports asthma attack with SOB , productive cough and wheezing this evening .

## 2022-11-24 DIAGNOSIS — J4521 Mild intermittent asthma with (acute) exacerbation: Secondary | ICD-10-CM | POA: Diagnosis not present

## 2022-11-24 LAB — COMPREHENSIVE METABOLIC PANEL
ALT: 17 U/L (ref 0–44)
AST: 20 U/L (ref 15–41)
Albumin: 4.2 g/dL (ref 3.5–5.0)
Alkaline Phosphatase: 71 U/L (ref 38–126)
Anion gap: 14 (ref 5–15)
BUN: 13 mg/dL (ref 8–23)
CO2: 22 mmol/L (ref 22–32)
Calcium: 9.3 mg/dL (ref 8.9–10.3)
Chloride: 104 mmol/L (ref 98–111)
Creatinine, Ser: 1.54 mg/dL — ABNORMAL HIGH (ref 0.44–1.00)
GFR, Estimated: 36 mL/min — ABNORMAL LOW (ref >60)
Glucose, Bld: 114 mg/dL — ABNORMAL HIGH (ref 70–99)
Potassium: 3.2 mmol/L — ABNORMAL LOW (ref 3.5–5.1)
Sodium: 140 mmol/L (ref 135–145)
Total Bilirubin: 0.7 mg/dL (ref 0.3–1.2)
Total Protein: 7.8 g/dL (ref 6.5–8.1)

## 2022-11-24 LAB — TROPONIN I (HIGH SENSITIVITY)
Troponin I (High Sensitivity): 6 ng/L (ref <18)
Troponin I (High Sensitivity): 9 ng/L (ref <18)

## 2022-11-24 LAB — D-DIMER, QUANTITATIVE: D-Dimer, Quant: 0.33 ug{FEU}/mL (ref 0.00–0.50)

## 2022-11-24 LAB — SARS CORONAVIRUS 2 BY RT PCR: SARS Coronavirus 2 by RT PCR: NEGATIVE

## 2022-11-24 MED ORDER — ALBUTEROL SULFATE HFA 108 (90 BASE) MCG/ACT IN AERS
2.0000 | INHALATION_SPRAY | RESPIRATORY_TRACT | Status: DC | PRN
  Administered 2022-11-24: 2 via RESPIRATORY_TRACT
  Filled 2022-11-24: qty 6.7

## 2022-11-24 MED ORDER — ALBUTEROL SULFATE HFA 108 (90 BASE) MCG/ACT IN AERS: 2.0000 | INHALATION_SPRAY | RESPIRATORY_TRACT | 2 refills | Status: AC | PRN

## 2022-11-24 NOTE — ED Provider Notes (Signed)
[place] EMERGENCY DEPARTMENT AT [facility] Provider Note   CSN: [id number] Arrival date & time: 11/23/22  2244     History  Chief Complaint  Patient presents with   Asthma    Lindsay Bowen is a 72 y.o. female.  Presents to the emergency department for evaluation of shortness of breath.  Patient reports that the symptoms began tonight at [employer] study.  She does have a history of asthma.  She felt like she was wheezing and did develop a cough.  No fever.       Home Medications Prior to Admission medications   Medication Sig Start Date End Date Taking? Authorizing Provider  amLODipine (NORVASC) 10 MG tablet Take 10 mg by mouth daily.   Yes [provider]  atorvastatin (LIPITOR) 40 MG tablet Take 40 mg by mouth daily. 12/09/20  Yes [provider]  escitalopram (LEXAPRO) 10 MG tablet Take 1 tablet (10 mg total) by mouth daily. 12/15/20  Yes Alwyn Ren, MD  FARXIGA 5 MG TABS tablet Take 1 tablet by mouth daily.   Yes [provider]  Fluticasone-Salmeterol (ADVAIR DISKUS) 100-50 MCG/DOSE AEPB Inhale 1 puff into the lungs 2 (two) times daily. Need office visit for additional refills. 04/03/12  Yes Jeffery, Chelle, PA  hydrochlorothiazide (HYDRODIURIL) 25 MG tablet Take 1 tablet by mouth every morning. 10/20/22  Yes [provider]  losartan (COZAAR) 50 MG tablet Take 50 mg by mouth daily. 10/20/22  Yes [provider]      Allergies    Red dye #40 (allura red)    Review of Systems   Review of Systems  Physical Exam Updated Vital Signs BP 123/67   Pulse 88   Temp 98.4 F (36.9 C) (Oral)   Resp 20   SpO2 99%  Physical Exam Vitals and nursing note reviewed.  Constitutional:      General: She is not in acute distress.    Appearance: She is well-developed.  HENT:     Head: Normocephalic and atraumatic.     Mouth/Throat:     Mouth: Mucous membranes are moist.  Eyes:     General: Vision grossly intact.  Gaze aligned appropriately.     Extraocular Movements: Extraocular movements intact.     Conjunctiva/sclera: Conjunctivae normal.  Cardiovascular:     Rate and Rhythm: Normal rate and regular rhythm.     Pulses: Normal pulses.     Heart sounds: Normal heart sounds, S1 normal and S2 normal. No murmur heard.    No friction rub. No gallop.  Pulmonary:     Effort: Pulmonary effort is normal. No respiratory distress.     Breath sounds: Normal breath sounds.  Abdominal:     General: Bowel sounds are normal.     Palpations: Abdomen is soft.     Tenderness: There is no abdominal tenderness. There is no guarding or rebound.     Hernia: No hernia is present.  Musculoskeletal:        General: No swelling.     Cervical back: Full passive range of motion without pain, normal range of motion and neck supple. Tenderness present. No spinous process tenderness or muscular tenderness. Normal range of motion.       Back:     Right lower leg: No edema.     Left lower leg: No edema.  Skin:    General: Skin is warm and dry.     Capillary Refill: Capillary refill takes less than 2  seconds.     Findings: No ecchymosis, erythema, rash or wound.  Neurological:     General: No focal deficit present.     Mental Status: She is alert and oriented to person, place, and time.     GCS: GCS eye subscore is 4. GCS verbal subscore is 5. GCS motor subscore is 6.     Cranial Nerves: Cranial nerves 2-12 are intact.     Sensory: Sensation is intact.     Motor: Motor function is intact.     Coordination: Coordination is intact.  Psychiatric:        Attention and Perception: Attention normal.        Mood and Affect: Mood normal.        Speech: Speech normal.        Behavior: Behavior normal.     ED Results / Procedures / Treatments   Labs (all labs ordered are listed, but only abnormal results are displayed) Labs Reviewed  CBC WITH DIFFERENTIAL/PLATELET - Abnormal; Notable for the following components:       Result Value   WBC 14.6 (*)    Neutro Abs 10.7 (*)    Monocytes Absolute 1.1 (*)    All other components within normal limits  COMPREHENSIVE METABOLIC PANEL - Abnormal; Notable for the following components:   Potassium 3.2 (*)    Glucose, Bld 114 (*)    Creatinine, Ser 1.54 (*)    GFR, Estimated 36 (*)    All other components within normal limits  SARS CORONAVIRUS 2 BY RT PCR  D-DIMER, QUANTITATIVE (NOT AT [facility])  TROPONIN I (HIGH SENSITIVITY)  TROPONIN I (HIGH SENSITIVITY)    EKG EKG Interpretation Date/Time:  Wednesday November 23 2022 23:23:52 EDT Ventricular Rate:  112 PR Interval:  152 QRS Duration:  76 QT Interval:  350 QTC Calculation: 477 R Axis:   48  Text Interpretation: Sinus tachycardia Otherwise normal ECG When compared with ECG of 12-Dec-2020 09:51, rate is faster Confirmed by Gilda Crease [id number]) on 11/24/2022 12:02:43 AM  Radiology DG Chest 2 View  Result Date: 11/24/2022 CLINICAL DATA:  Shortness of breath and productive cough. EXAM: CHEST - 2 VIEW COMPARISON:  December 12, 2020 FINDINGS: The heart size and mediastinal contours are within normal limits. Mildly decreased lung volumes are noted with mild atelectasis seen within the bilateral lung bases. There is no evidence of acute infiltrate, pleural effusion or pneumothorax. Radiopaque surgical clips are seen within the right upper quadrant. Multilevel degenerative changes are seen throughout the thoracic spine. IMPRESSION: Mildly decreased lung volumes with mild bibasilar atelectasis. Electronically Signed   By: Aram Candela M.D.   On: 11/24/2022 00:34    Procedures Procedures    Medications Ordered in ED Medications  albuterol (PROVENTIL) (2.5 MG/3ML) 0.083% nebulizer solution 2.5 mg (2.5 mg Nebulization Given 11/23/22 2324)    ED Course/ Medical Decision Making/ A&P                                 Medical Decision Making Amount and/or Complexity of Data Reviewed External Data Reviewed:  ECG. Labs: ordered. Decision-making details documented in ED Course. Radiology: ordered and independent interpretation performed. Decision-making details documented in ED Course. ECG/medicine tests: ordered and independent interpretation performed. Decision-making details documented in ED Course.  Risk Prescription drug management.   Differential Diagnosis considered includes, but not limited to: Asthma exacerbation; Bronchitis; Pneumonia; CHF; ACS; PE  Presents to the  emergency department for evaluation of shortness of breath.  Patient had onset of shortness of breath tonight while at church.  She does have a history of asthma.  There has been a cough since she started having shortness of breath tonight, no fever.  She also reports some pain on the right posterior ribs, especially with coughing.  At arrival, patient is hypertensive and tachycardic.  She is not hypoxic.  Lungs are clear, no active wheezing.  Cardiopulmonary workup performed.  Chest x-ray without pneumonia.  EKG without ischemic changes.  Troponin negative x 2.  D-dimer is normal, doubt PE.  COVID-negative.  Patient appears comfortable currently.  Workup has been reassuring.        Final Clinical Impression(s) / ED Diagnoses Final diagnoses:  Mild intermittent asthma with acute exacerbation    Rx / DC Orders ED Discharge Orders     None         Asyah Candler, Canary Brim, MD 11/24/22 [phone]

## 2022-11-29 DIAGNOSIS — R7989 Other specified abnormal findings of blood chemistry: Secondary | ICD-10-CM | POA: Diagnosis not present

## 2022-11-29 DIAGNOSIS — J4530 Mild persistent asthma, uncomplicated: Secondary | ICD-10-CM | POA: Diagnosis not present

## 2022-11-29 DIAGNOSIS — N289 Disorder of kidney and ureter, unspecified: Secondary | ICD-10-CM | POA: Diagnosis not present

## 2022-11-29 DIAGNOSIS — D729 Disorder of white blood cells, unspecified: Secondary | ICD-10-CM | POA: Diagnosis not present

## 2022-11-29 DIAGNOSIS — F03B0 Unspecified dementia, moderate, without behavioral disturbance, psychotic disturbance, mood disturbance, and anxiety: Secondary | ICD-10-CM | POA: Diagnosis not present

## 2023-01-16 DIAGNOSIS — K317 Polyp of stomach and duodenum: Secondary | ICD-10-CM | POA: Diagnosis not present

## 2023-01-16 DIAGNOSIS — R1013 Epigastric pain: Secondary | ICD-10-CM | POA: Diagnosis not present

## 2023-01-25 DIAGNOSIS — R0602 Shortness of breath: Secondary | ICD-10-CM | POA: Diagnosis not present

## 2023-01-25 DIAGNOSIS — Z79899 Other long term (current) drug therapy: Secondary | ICD-10-CM | POA: Diagnosis not present

## 2023-01-25 DIAGNOSIS — E876 Hypokalemia: Secondary | ICD-10-CM | POA: Diagnosis not present

## 2023-01-25 DIAGNOSIS — J4521 Mild intermittent asthma with (acute) exacerbation: Secondary | ICD-10-CM | POA: Diagnosis not present

## 2023-01-25 DIAGNOSIS — Z20822 Contact with and (suspected) exposure to covid-19: Secondary | ICD-10-CM | POA: Insufficient documentation

## 2023-01-25 DIAGNOSIS — I10 Essential (primary) hypertension: Secondary | ICD-10-CM | POA: Diagnosis not present

## 2023-01-25 DIAGNOSIS — Z7951 Long term (current) use of inhaled steroids: Secondary | ICD-10-CM | POA: Insufficient documentation

## 2023-01-26 ENCOUNTER — Emergency Department (HOSPITAL_COMMUNITY)
Admission: EM | Admit: 2023-01-26 | Discharge: 2023-01-26 | Disposition: A | Discharge status: Home / Self Care | Payer: Medicare Other | Attending: Emergency Medicine | Admitting: Emergency Medicine

## 2023-01-26 ENCOUNTER — Emergency Department (HOSPITAL_COMMUNITY): Payer: Medicare Other

## 2023-01-26 ENCOUNTER — Other Ambulatory Visit: Payer: Self-pay

## 2023-01-26 ENCOUNTER — Encounter (HOSPITAL_COMMUNITY): Payer: Self-pay | Admitting: *Deleted

## 2023-01-26 DIAGNOSIS — J4521 Mild intermittent asthma with (acute) exacerbation: Secondary | ICD-10-CM

## 2023-01-26 DIAGNOSIS — E876 Hypokalemia: Secondary | ICD-10-CM

## 2023-01-26 DIAGNOSIS — R0602 Shortness of breath: Secondary | ICD-10-CM | POA: Diagnosis not present

## 2023-01-26 LAB — BASIC METABOLIC PANEL
Anion gap: 12 (ref 5–15)
BUN: 14 mg/dL (ref 8–23)
CO2: 24 mmol/L (ref 22–32)
Calcium: 9.5 mg/dL (ref 8.9–10.3)
Chloride: 102 mmol/L (ref 98–111)
Creatinine, Ser: 1.47 mg/dL — ABNORMAL HIGH (ref 0.44–1.00)
GFR, Estimated: 38 mL/min — ABNORMAL LOW (ref >60)
Glucose, Bld: 139 mg/dL — ABNORMAL HIGH (ref 70–99)
Potassium: 2.8 mmol/L — ABNORMAL LOW (ref 3.5–5.1)
Sodium: 138 mmol/L (ref 135–145)

## 2023-01-26 LAB — CBC WITH DIFFERENTIAL/PLATELET
Abs Immature Granulocytes: 0.05 10*3/uL (ref 0.00–0.07)
Basophils Absolute: 0 10*3/uL (ref 0.0–0.1)
Basophils Relative: 0 %
Eosinophils Absolute: 0 10*3/uL (ref 0.0–0.5)
Eosinophils Relative: 0 %
HCT: 41.4 % (ref 36.0–46.0)
Hemoglobin: 14 g/dL (ref 12.0–15.0)
Immature Granulocytes: 0 %
Lymphocytes Relative: 13 %
Lymphs Abs: 1.8 10*3/uL (ref 0.7–4.0)
MCH: 27.9 pg (ref 26.0–34.0)
MCHC: 33.8 g/dL (ref 30.0–36.0)
MCV: 82.6 fL (ref 80.0–100.0)
Monocytes Absolute: 0.9 10*3/uL (ref 0.1–1.0)
Monocytes Relative: 6 %
Neutro Abs: 11.4 10*3/uL — ABNORMAL HIGH (ref 1.7–7.7)
Neutrophils Relative %: 81 %
Platelets: 336 10*3/uL (ref 150–400)
RBC: 5.01 MIL/uL (ref 3.87–5.11)
RDW: 14.4 % (ref 11.5–15.5)
WBC: 14.2 10*3/uL — ABNORMAL HIGH (ref 4.0–10.5)
nRBC: 0 % (ref 0.0–0.2)

## 2023-01-26 LAB — RESP PANEL BY RT-PCR (RSV, FLU A&B, COVID)  RVPGX2
Influenza A by PCR: NEGATIVE
Influenza B by PCR: NEGATIVE
Resp Syncytial Virus by PCR: NEGATIVE
SARS Coronavirus 2 by RT PCR: NEGATIVE

## 2023-01-26 LAB — TROPONIN I (HIGH SENSITIVITY): Troponin I (High Sensitivity): 7 ng/L (ref <18)

## 2023-01-26 MED ORDER — POTASSIUM CHLORIDE CRYS ER 20 MEQ PO TBCR
40.0000 meq | EXTENDED_RELEASE_TABLET | Freq: Once | ORAL | Status: AC
  Administered 2023-01-26: 40 meq via ORAL
  Filled 2023-01-26: qty 2

## 2023-01-26 MED ORDER — POTASSIUM CHLORIDE CRYS ER 20 MEQ PO TBCR: 20.0000 meq | EXTENDED_RELEASE_TABLET | Freq: Every day | ORAL | 0 refills | Status: AC

## 2023-01-26 NOTE — ED Triage Notes (Signed)
The pt is c/o diff breathing since this afternoon she has used her inhaler  and that has helped

## 2023-01-26 NOTE — ED Notes (Signed)
The pt was taken to xray before I could  triage this pt

## 2023-01-26 NOTE — ED Provider Notes (Signed)
[place] EMERGENCY DEPARTMENT AT [facility] Provider Note   CSN: [id number] Arrival date & time: 01/25/23  2358     History  Chief Complaint  Patient presents with   Shortness of Breath    Lindsay Bowen is a 72 y.o. female who presents with episode of shortness of breath.  Patient with history of asthma, with inhaled corticosteroid daily.  Has as needed albuterol inhaler.  Accompanied by her sister-in-law at the bedside.  States that today she was feeling chest tightness and used her albuterol inhaler with significant improvement of her symptoms.  Wanted to be evaluated for reassurance, states that her husband died a few years ago and she was concerned that it may be related to her heart.  Asymptomatic at this time and feeling well.  States she uses her albuterol inhaler twice today.  No chest pain or shortness of breath at this time.  I reviewed her medical records.  Striae of hypertension and asthma.  States that her asthma typically flares "in the middle times" rather than in the winter or the heat of the summer.  No anticoagulation.  HPI     Home Medications Prior to Admission medications   Medication Sig Start Date End Date Taking? Authorizing Provider  potassium chloride SA (KLOR-CON M) 20 MEQ tablet Take 1 tablet (20 mEq total) by mouth daily for 3 days. 01/26/23 01/29/23 Yes Katharine Rochefort R, PA-C  albuterol (VENTOLIN HFA) 108 (90 Base) MCG/ACT inhaler Inhale 2 puffs into the lungs every 4 (four) hours as needed for wheezing or shortness of breath. 11/24/22   Pollina, Canary Brim, MD  amLODipine (NORVASC) 10 MG tablet Take 10 mg by mouth daily.    [provider]  atorvastatin (LIPITOR) 40 MG tablet Take 40 mg by mouth daily. 12/09/20   [provider]  escitalopram (LEXAPRO) 10 MG tablet Take 1 tablet (10 mg total) by mouth daily. 12/15/20   Alwyn Ren, MD  FARXIGA 5 MG TABS tablet Take 1 tablet by mouth daily.    [provider]  Fluticasone-Salmeterol (ADVAIR DISKUS) 100-50 MCG/DOSE AEPB Inhale 1 puff into the lungs 2 (two) times daily. Need office visit for additional refills. 04/03/12   Porfirio Oar, PA  hydrochlorothiazide (HYDRODIURIL) 25 MG tablet Take 1 tablet by mouth every morning. 10/20/22   [provider]  losartan (COZAAR) 50 MG tablet Take 50 mg by mouth daily. 10/20/22   [provider]      Allergies    Red dye #40 (allura red)    Review of Systems   Review of Systems  Respiratory:  Positive for chest tightness and shortness of breath.     Physical Exam Updated Vital Signs BP (!) 149/72 (BP Location: Right Arm)   Pulse 99   Temp 98.3 F (36.8 C) (Oral)   Resp 20   Ht 5\' 1"  (1.549 m)   Wt 87.5 kg   SpO2 98%   BMI 36.45 kg/m  Physical Exam Vitals and nursing note reviewed.  Constitutional:      Appearance: She is not ill-appearing or toxic-appearing.  HENT:     Head: Normocephalic and atraumatic.     Mouth/Throat:     Mouth: Mucous membranes are moist.     Pharynx: No oropharyngeal exudate or posterior oropharyngeal erythema.  Eyes:     General:        Right eye: No discharge.        Left eye: No discharge.  Conjunctiva/sclera: Conjunctivae normal.  Cardiovascular:     Rate and Rhythm: Normal rate and regular rhythm.     Pulses: Normal pulses.  Pulmonary:     Effort: Pulmonary effort is normal. No tachypnea, bradypnea, accessory muscle usage, prolonged expiration or respiratory distress.     Breath sounds: Normal breath sounds. No wheezing or rales.  Chest:     Chest wall: No mass, tenderness or edema.  Abdominal:     General: Bowel sounds are normal. There is no distension.     Tenderness: There is no abdominal tenderness.  Musculoskeletal:        General: No deformity.     Cervical back: Neck supple.  Skin:    General: Skin is warm and dry.     Capillary Refill: Capillary refill takes less than 2 seconds.  Neurological:      General: No focal deficit present.     Mental Status: She is alert and oriented to person, place, and time. Mental status is at baseline.  Psychiatric:        Mood and Affect: Mood normal.     ED Results / Procedures / Treatments   Labs (all labs ordered are listed, but only abnormal results are displayed) Labs Reviewed  BASIC METABOLIC PANEL - Abnormal; Notable for the following components:      Result Value   Potassium 2.8 (*)    Glucose, Bld 139 (*)    Creatinine, Ser 1.47 (*)    GFR, Estimated 38 (*)    All other components within normal limits  CBC WITH DIFFERENTIAL/PLATELET - Abnormal; Notable for the following components:   WBC 14.2 (*)    Neutro Abs 11.4 (*)    All other components within normal limits  RESP PANEL BY RT-PCR (RSV, FLU A&B, COVID)  [id number]  TROPONIN I (HIGH SENSITIVITY)  TROPONIN I (HIGH SENSITIVITY)    EKG None  Radiology DG Chest 2 View  Result Date: 01/26/2023 CLINICAL DATA:  Shortness of breath EXAM: CHEST - 2 VIEW COMPARISON:  11/23/2022 FINDINGS: The heart size and mediastinal contours are within normal limits. Both lungs are clear. The visualized skeletal structures are unremarkable. IMPRESSION: No active cardiopulmonary disease. Electronically Signed   By: Alcide Clever M.D.   On: 01/26/2023 01:21    Procedures Procedures    Medications Ordered in ED Medications  potassium chloride SA (KLOR-CON M) CR tablet 40 mEq (40 mEq Oral Given 01/26/23 0302)    ED Course/ Medical Decision Making/ A&P                                 Medical Decision Making 72 year-old female presents with SOB now resolved.  Hypertensive and mildly tachycardic and tachypneic on intake.  At time of my evaluation patient's vital signs are normal.  Cardiopulmonary exam is reassuring, abdominal exam is benign.  Patient neurovascular intact and at her mental status baseline per family at bedside.  Amount and/or Complexity of Data Reviewed Labs:     Details: CBC with  leukocytosis of 14,000, BMP with hypokalemia of 2.8 repleted orally, creatinine of 1.4 at patient's baseline.  Troponin normal.  RVP negative.  Radiology:     Details: Chest x-ray negative for acute cardiopulmonary disease, visualized by this provider.   ECG/medicine tests:     Details:   EKG with sinus tachycardia, no ischemic changes.    Risk Prescription drug management.   Clinical picture most consistent  with acute mild exacerbation of asthma resolved with home albuterol inhaler.  Clinical concern for more general etiology of this patient symptoms that would warrant further ED workup or inpatient management is exceedingly low.  Ayanni and her family  voiced understanding of her medical evaluation and treatment plan. Each of their questions answered to their expressed satisfaction.  Return precautions were given.  Patient is well-appearing, stable, and was discharged in good condition.  This chart was dictated using voice recognition software, Dragon. Despite the best efforts of this provider to proofread and correct errors, errors may still occur which can change documentation meaning.         Final Clinical Impression(s) / ED Diagnoses Final diagnoses:  Mild intermittent asthma with exacerbation  Hypokalemia    Rx / DC Orders ED Discharge Orders          Ordered    potassium chloride SA (KLOR-CON M) 20 MEQ tablet  Daily        01/26/23 0246              Banita Lehn, Eugene Gavia, PA-C 01/26/23 1610    Gilda Crease, MD 01/27/23 [phone]

## 2023-01-26 NOTE — Discharge Instructions (Addendum)
You were seen in the ER today for your shortness of breath. This was likely related to your asthma. You may use your albuterol inhaler as needed. Please follow up with your primary care doctor. Take the prescribed potassium supplement and ask your primary care doctor to recheck this level. Return to the ER with any new severe symptoms.

## 2023-01-26 NOTE — ED Provider Triage Note (Addendum)
Emergency Medicine Provider Triage Evaluation Note  Lindsay Bowen , a 72 y.o. female  was evaluated in triage.  Pt complains of shortness of breath and chest pain.  Symptoms began 2-3 days ago.  Patient reports she felt like she was going to pass out today which worried her.  She feels very weak.  Denies syncope.  She has been using her prescribed inhalers.  Review of Systems  Positive: As above Negative: As above  Physical Exam  BP (!) 163/68 (BP Location: Right Arm)   Pulse (!) 103   Temp 99.3 F (37.4 C) (Oral)   Resp (!) 22   SpO2 96%  Gen:   Awake, no distress   Resp:  Normal effort, no wheezing  MSK:   Moves extremities without difficulty  Other:  Patient speaking in full sentences  Medical Decision Making  Medically screening exam initiated at 12:29 AM.  Appropriate orders placed.  Lindsay Bowen was informed that the remainder of the evaluation will be completed by another provider, this initial triage assessment does not replace that evaluation, and the importance of remaining in the ED until their evaluation is complete.     Melton Alar R, PA-C 01/26/23 0033    Melton Alar R, PA-C 01/26/23 0040

## 2023-02-24 DIAGNOSIS — I700 Atherosclerosis of aorta: Secondary | ICD-10-CM | POA: Diagnosis not present

## 2023-02-24 DIAGNOSIS — I129 Hypertensive chronic kidney disease with stage 1 through stage 4 chronic kidney disease, or unspecified chronic kidney disease: Secondary | ICD-10-CM | POA: Diagnosis not present

## 2023-02-24 DIAGNOSIS — J4530 Mild persistent asthma, uncomplicated: Secondary | ICD-10-CM | POA: Diagnosis not present

## 2023-02-24 DIAGNOSIS — Z23 Encounter for immunization: Secondary | ICD-10-CM | POA: Diagnosis not present

## 2023-02-24 DIAGNOSIS — N1832 Chronic kidney disease, stage 3b: Secondary | ICD-10-CM | POA: Diagnosis not present

## 2023-04-27 DIAGNOSIS — N1832 Chronic kidney disease, stage 3b: Secondary | ICD-10-CM | POA: Diagnosis not present

## 2023-04-27 DIAGNOSIS — E7800 Pure hypercholesterolemia, unspecified: Secondary | ICD-10-CM | POA: Diagnosis not present

## 2023-04-27 DIAGNOSIS — J4530 Mild persistent asthma, uncomplicated: Secondary | ICD-10-CM | POA: Diagnosis not present

## 2023-04-30 IMAGING — CT CT CHEST W/O CM
2 of 4 series · 15 of 36 positions shown, 18 images · non-contrast
Comparison: CT abdomen pelvis 12/12/2020

CLINICAL DATA: Unintended weight loss.  Admitted for abnormal labs

EXAM:
CT CHEST WITHOUT CONTRAST
TECHNIQUE: Multidetector CT imaging of the chest was performed following the
standard protocol without IV contrast.

[Series 2: thorax · axial · 0.65mm/px · z∈[-167,+83]mm · 12 of 147 slices shown, 15 images]
[im 11/147  mediastinal]
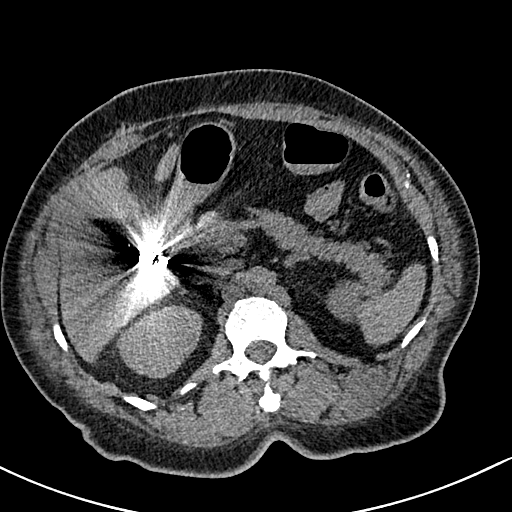
[im 11/147  lung]
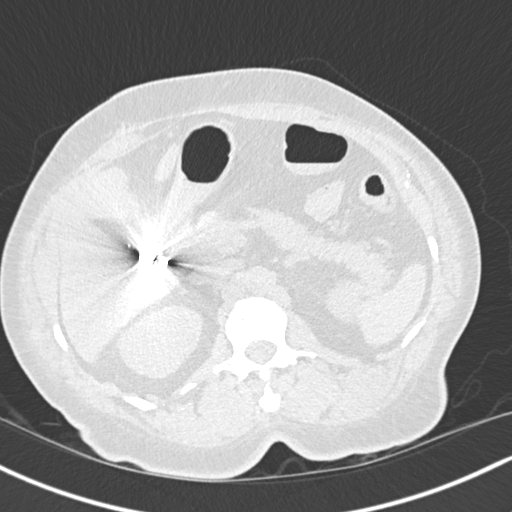
[im 21/147  lung]
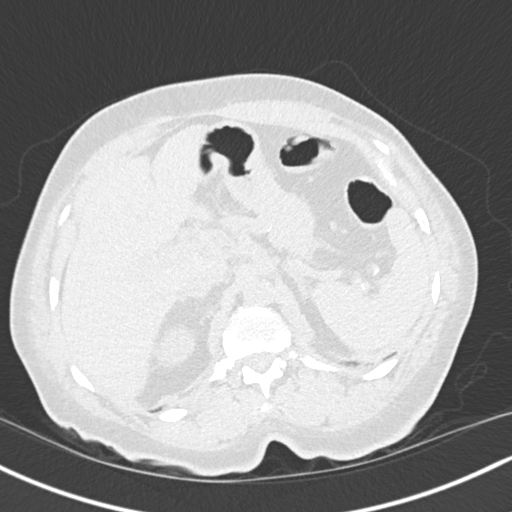
[im 32/147  lung]
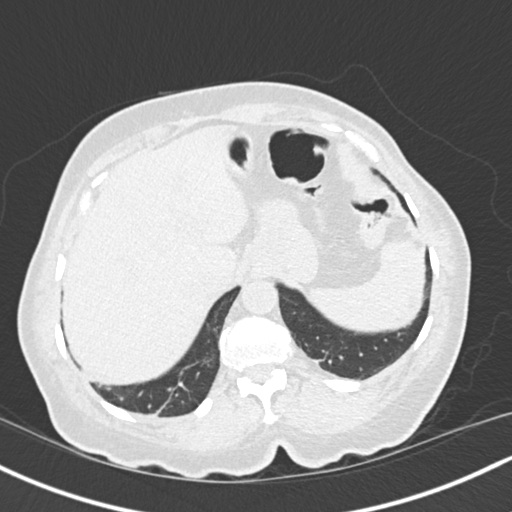
[im 42/147  lung]
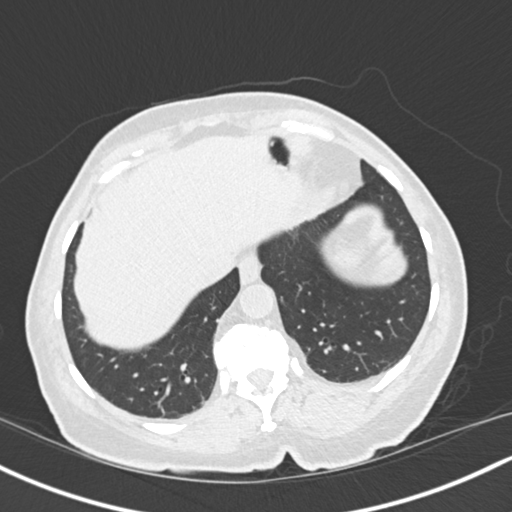
[im 53/147  mediastinal]
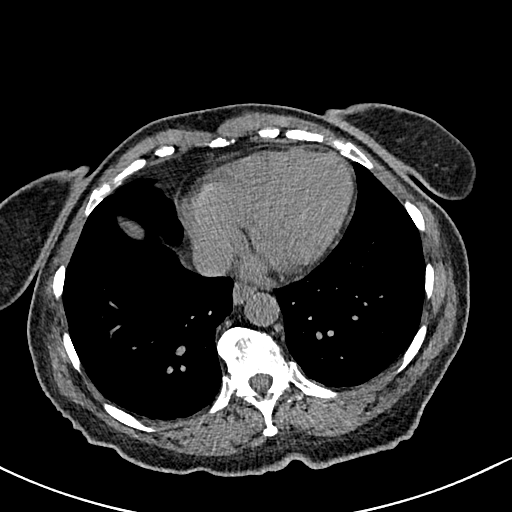
[im 53/147  lung]
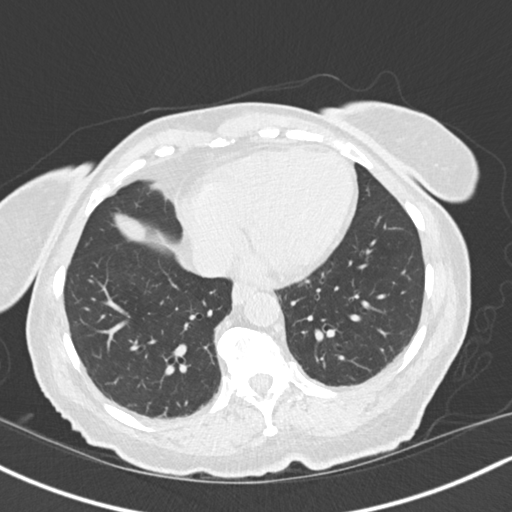
[im 63/147  lung]
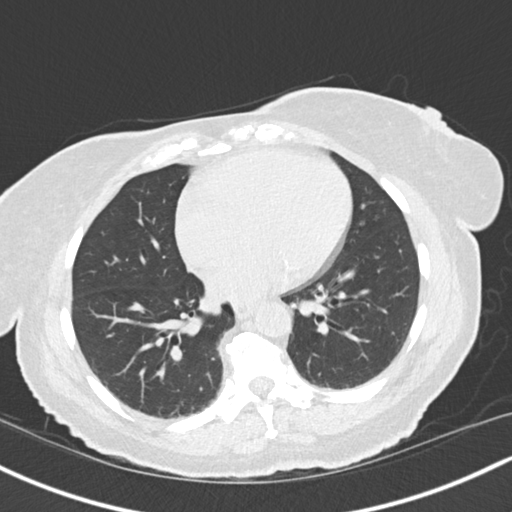
[im 84/147  lung]
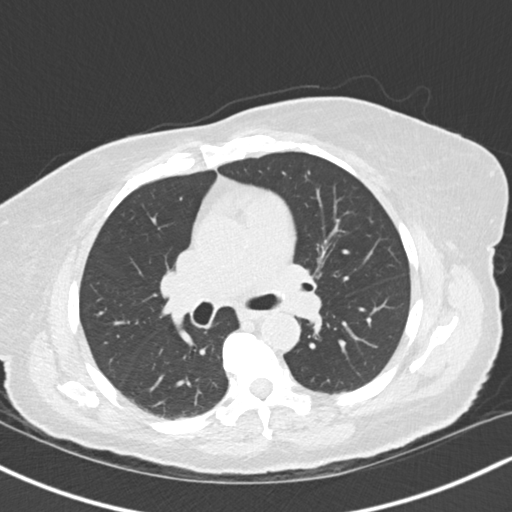
[im 94/147  lung]
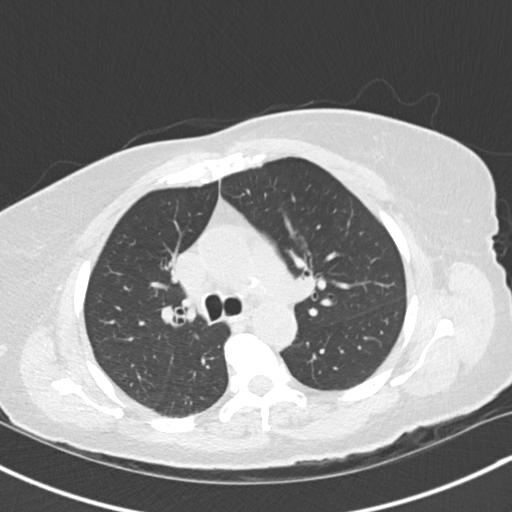
[im 105/147  mediastinal]
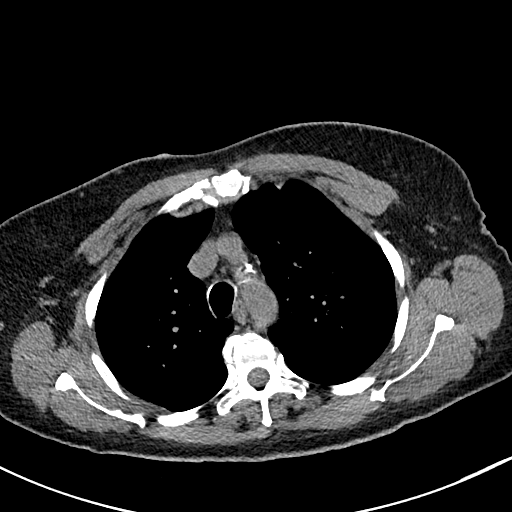
[im 105/147  lung]
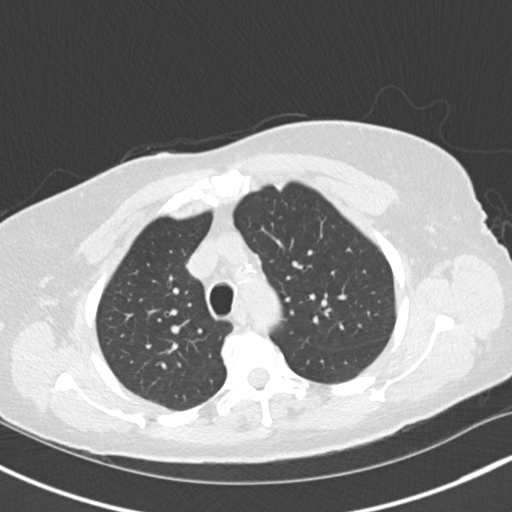
[im 115/147  lung]
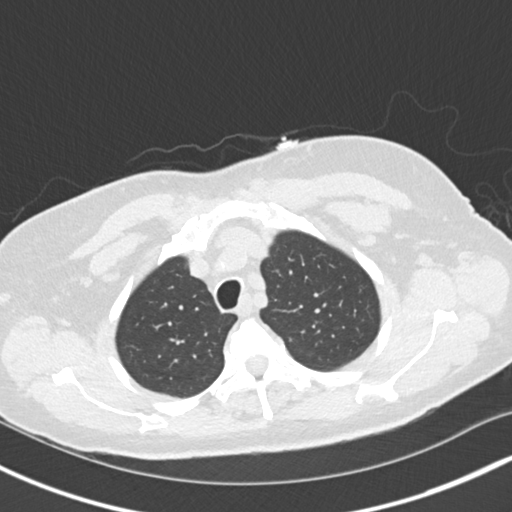
[im 126/147  lung]
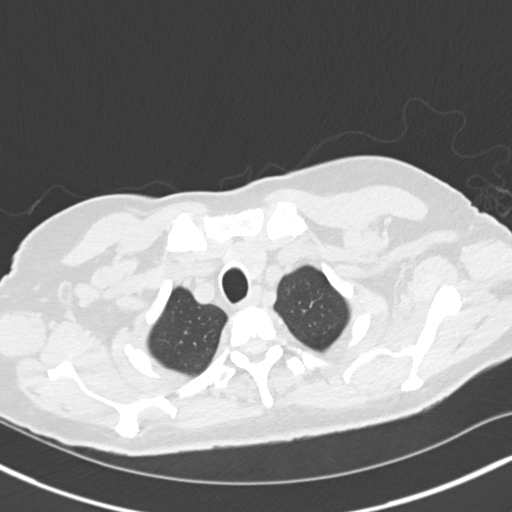
[im 136/147  lung]
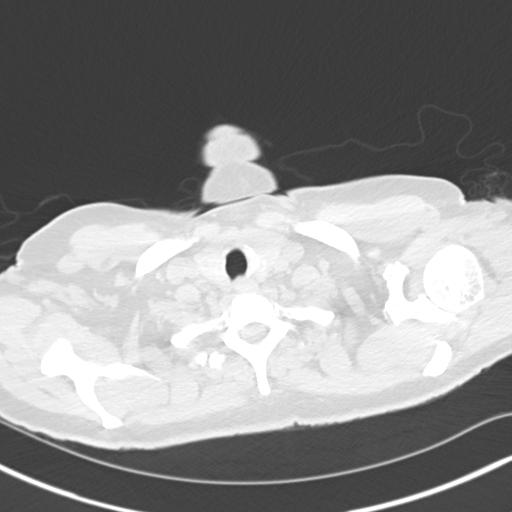

[Series 5: coronal · coronal · 0.58mm/px · 3 of 124 slices shown]
[im 25/124  lung]
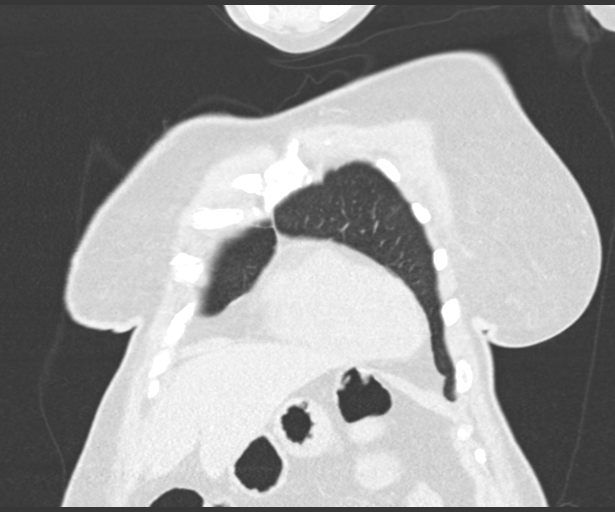
[im 50/124  lung]
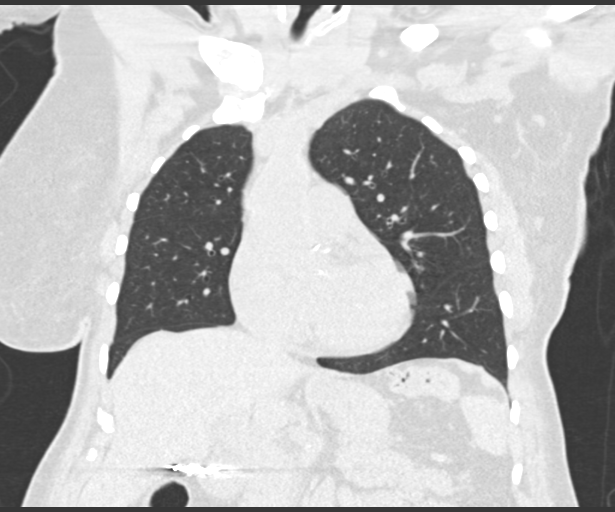
[im 74/124  lung]
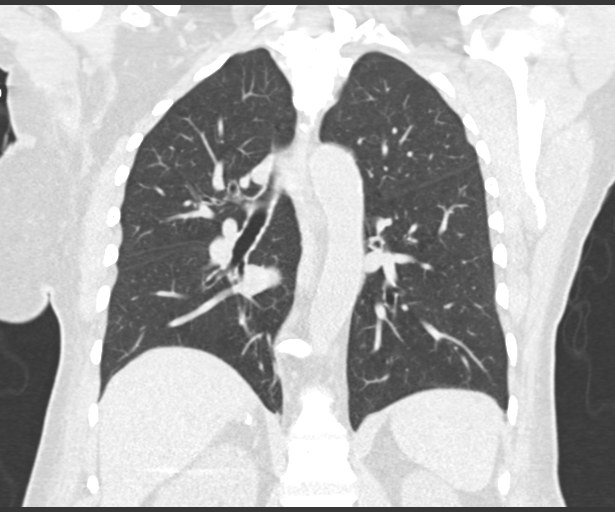

[15 of 36 positions shown; findings below may reference images not displayed]

FINDINGS: Cardiovascular: Normal heart size. No significant pericardial
effusion. The thoracic aorta is normal in caliber. Mild
atherosclerotic plaque of the thoracic aorta. No coronary artery
calcifications.

Mediastinum/Nodes: No gross hilar adenopathy, noting limited
sensitivity for the detection of hilar adenopathy on this
noncontrast study. No enlarged mediastinal or axillary lymph nodes.
Thyroid gland, trachea, and esophagus demonstrate no significant
findings.

Lungs/Pleura: Bilateral lower lobe subsegmental atelectasis. No
focal consolidation. No pulmonary nodule. No pulmonary mass. No
pleural effusion. No pneumothorax.

Upper Abdomen: Nonspecific curvilinear calcifications right adrenal
gland. Status post cholecystectomy. Otherwise no acute abnormality.

Musculoskeletal:

No chest wall abnormality.

No suspicious lytic or blastic osseous lesions. No acute displaced
fracture. Multilevel degenerative changes of the spine.
IMPRESSION: No acute intrathoracic abnormality with limited evaluation on this
noncontrast study.

## 2023-07-13 DIAGNOSIS — Z6835 Body mass index (BMI) 35.0-35.9, adult: Secondary | ICD-10-CM | POA: Diagnosis not present

## 2023-07-13 DIAGNOSIS — I129 Hypertensive chronic kidney disease with stage 1 through stage 4 chronic kidney disease, or unspecified chronic kidney disease: Secondary | ICD-10-CM | POA: Diagnosis not present

## 2023-07-13 DIAGNOSIS — N1832 Chronic kidney disease, stage 3b: Secondary | ICD-10-CM | POA: Diagnosis not present

## 2023-07-13 DIAGNOSIS — E7800 Pure hypercholesterolemia, unspecified: Secondary | ICD-10-CM | POA: Diagnosis not present

## 2023-07-13 DIAGNOSIS — F03C0 Unspecified dementia, severe, without behavioral disturbance, psychotic disturbance, mood disturbance, and anxiety: Secondary | ICD-10-CM | POA: Diagnosis not present

## 2023-07-13 DIAGNOSIS — J4530 Mild persistent asthma, uncomplicated: Secondary | ICD-10-CM | POA: Diagnosis not present

## 2023-07-17 ENCOUNTER — Telehealth: Payer: Self-pay

## 2023-07-17 DIAGNOSIS — F03C0 Unspecified dementia, severe, without behavioral disturbance, psychotic disturbance, mood disturbance, and anxiety: Secondary | ICD-10-CM

## 2023-07-17 NOTE — Patient Outreach (Signed)
 Submitted faxed referral for assistance.  [facility]  Population Health Care Management Assistant  Direct Dial: [phone]  Fax: [phone] Website: [email or web address]

## 2023-07-18 ENCOUNTER — Telehealth: Payer: Self-pay | Admitting: *Deleted

## 2023-07-18 NOTE — Progress Notes (Signed)
 Complex Care Management Note Care Guide Note  07/18/2023 Name: Lindsay Bowen MRN: [id number] DOB: [birth year]   Complex Care Management Outreach Attempts: An unsuccessful telephone outreach was attempted today to offer the patient information about available complex care management services.  Follow Up Plan:  Additional outreach attempts will be made to offer the patient complex care management information and services.   Encounter Outcome:  No Answer  Barnie Bora  [facility]  [facility] Guide  Direct Dial: [phone]  Fax [phone]

## 2023-07-25 NOTE — Progress Notes (Signed)
 Complex Care Management Note Care Guide Note  07/25/2023 Name: RANELLE AUKER MRN: [id number] DOB: [birth year]   Complex Care Management Outreach Attempts: A second unsuccessful outreach was attempted today to offer the patient with information about available complex care management services.  Follow Up Plan:  Additional outreach attempts will be made to offer the patient complex care management information and services.   Encounter Outcome:  No Answer  Barnie Bora  [facility]  [facility] Guide  Direct Dial: [phone]  Fax [phone]

## 2023-07-26 NOTE — Progress Notes (Signed)
 Complex Care Management Note  Care Guide Note 07/26/2023 Name: Lindsay Bowen MRN: [id number] DOB: 10/04/1950  Lindsay Bowen is a 73 y.o. year old female who sees Perley Bradley, MD for primary care. I reached out to Barney Boozer by phone today to offer complex care management services.  [name] was given information about Complex Care Management services today including:   The Complex Care Management services include support from the care team which includes your Nurse Care Manager, Clinical Social Worker, or Pharmacist.  The Complex Care Management team is here to help remove barriers to the health concerns and goals most important to you. Complex Care Management services are voluntary, and the patient may decline or stop services at any time by request to their care team member.   Complex Care Management Consent Status: Patient did not agree to participate in complex care management services at this time.  Follow up plan: None   Encounter Outcome:  Patient Refused  Barnie Bora  [facility]  [facility], [facility] Guide  Direct Dial: [phone]  Fax [phone]

## 2023-07-31 ENCOUNTER — Other Ambulatory Visit: Payer: Self-pay

## 2023-07-31 ENCOUNTER — Encounter (HOSPITAL_COMMUNITY): Payer: Self-pay

## 2023-07-31 ENCOUNTER — Emergency Department (HOSPITAL_COMMUNITY)

## 2023-07-31 ENCOUNTER — Emergency Department (HOSPITAL_COMMUNITY)
Admission: EM | Admit: 2023-07-31 | Discharge: 2023-07-31 | Discharge status: Against Medical Advice | Attending: Emergency Medicine | Admitting: Emergency Medicine

## 2023-07-31 DIAGNOSIS — J45909 Unspecified asthma, uncomplicated: Secondary | ICD-10-CM | POA: Diagnosis not present

## 2023-07-31 DIAGNOSIS — Z5321 Procedure and treatment not carried out due to patient leaving prior to being seen by health care provider: Secondary | ICD-10-CM | POA: Insufficient documentation

## 2023-07-31 DIAGNOSIS — R0602 Shortness of breath: Secondary | ICD-10-CM | POA: Diagnosis not present

## 2023-07-31 LAB — CBC WITH DIFFERENTIAL/PLATELET
Abs Immature Granulocytes: 0.13 10*3/uL — ABNORMAL HIGH (ref 0.00–0.07)
Basophils Absolute: 0 10*3/uL (ref 0.0–0.1)
Basophils Relative: 0 %
Eosinophils Absolute: 0.2 10*3/uL (ref 0.0–0.5)
Eosinophils Relative: 2 %
HCT: 41 % (ref 36.0–46.0)
Hemoglobin: 13.4 g/dL (ref 12.0–15.0)
Immature Granulocytes: 1 %
Lymphocytes Relative: 20 %
Lymphs Abs: 2.4 10*3/uL (ref 0.7–4.0)
MCH: 28.2 pg (ref 26.0–34.0)
MCHC: 32.7 g/dL (ref 30.0–36.0)
MCV: 86.1 fL (ref 80.0–100.0)
Monocytes Absolute: 0.8 10*3/uL (ref 0.1–1.0)
Monocytes Relative: 7 %
Neutro Abs: 8.3 10*3/uL — ABNORMAL HIGH (ref 1.7–7.7)
Neutrophils Relative %: 70 %
Platelets: 346 10*3/uL (ref 150–400)
RBC: 4.76 MIL/uL (ref 3.87–5.11)
RDW: 13.4 % (ref 11.5–15.5)
WBC: 11.7 10*3/uL — ABNORMAL HIGH (ref 4.0–10.5)
nRBC: 0 % (ref 0.0–0.2)

## 2023-07-31 LAB — COMPREHENSIVE METABOLIC PANEL WITH GFR
ALT: 16 U/L (ref 0–44)
AST: 25 U/L (ref 15–41)
Albumin: 4.3 g/dL (ref 3.5–5.0)
Alkaline Phosphatase: 67 U/L (ref 38–126)
Anion gap: 10 (ref 5–15)
BUN: 16 mg/dL (ref 8–23)
CO2: 25 mmol/L (ref 22–32)
Calcium: 9.5 mg/dL (ref 8.9–10.3)
Chloride: 103 mmol/L (ref 98–111)
Creatinine, Ser: 1.48 mg/dL — ABNORMAL HIGH (ref 0.44–1.00)
GFR, Estimated: 38 mL/min — ABNORMAL LOW (ref >60)
Glucose, Bld: 163 mg/dL — ABNORMAL HIGH (ref 70–99)
Potassium: 3.3 mmol/L — ABNORMAL LOW (ref 3.5–5.1)
Sodium: 138 mmol/L (ref 135–145)
Total Bilirubin: 0.9 mg/dL (ref 0.0–1.2)
Total Protein: 7.8 g/dL (ref 6.5–8.1)

## 2023-07-31 MED ORDER — ALBUTEROL SULFATE HFA 108 (90 BASE) MCG/ACT IN AERS
1.0000 | INHALATION_SPRAY | Freq: Once | RESPIRATORY_TRACT | Status: AC
  Administered 2023-07-31: 1 via RESPIRATORY_TRACT
  Filled 2023-07-31: qty 6.7

## 2023-07-31 NOTE — ED Provider Triage Note (Signed)
 Emergency Medicine Provider Triage Evaluation Note  Lindsay Bowen , a 73 y.o. female  was evaluated in triage.  Pt complains of SOB.  Review of Systems  Positive:  Negative:   Physical Exam  BP (!) 175/81   Pulse (!) 112   Temp 97.9 F (36.6 C) (Oral)   Resp 18   Ht 5\' 1"  (1.549 m)   Wt 64.4 kg   SpO2 100%   BMI 26.83 kg/m  Gen:   Awake, no distress   Resp:  Normal effort  MSK:   Moves extremities without difficulty  Other:    Medical Decision Making  Medically screening exam initiated at 11:29 PM.  Appropriate orders placed.  Barney Boozer was informed that the remainder of the evaluation will be completed by another provider, this initial triage assessment does not replace that evaluation, and the importance of remaining in the ED until their evaluation is complete.  Hx asthma. SOB since today. No cough, fever, nausea, vomiting, diarrhea. Denies leg swelling/pain.   [place], [place] 07/31/23 2330

## 2023-07-31 NOTE — ED Triage Notes (Signed)
 SOB that started 40 minutes ago, pt was sitting in the house watching tv when this started. Pt states she has been intermittently sob for a few weeks and has asthma. Respirations equal and unlabored in triage.

## 2023-07-31 NOTE — ED Notes (Signed)
 Save blue top and dark green obtain with IV placement

## 2023-07-31 NOTE — ED Notes (Signed)
 Pt sts she no longer wants to wait. Her ride needs to go to work. PIV removed per pt request.

## 2023-07-31 NOTE — ED Notes (Signed)
 [facility] [place] informed pt is awaiting MSE.

## 2023-09-19 IMAGING — US US BREAST*L* LIMITED INC AXILLA
1 series · 5 of 5 positions shown · non-contrast
Comparison: Previous exam(s).

CLINICAL DATA: 70-year-old female recalled from new baseline
screening mammogram dated 04/01/2021 for a possible left breast
mass.

EXAM:
ULTRASOUND OF THE LEFT BREAST

[Series 1: us breast*left* limited inc axilla · 0.06mm/px · 5 of 5 slices shown]
[im 1/5]
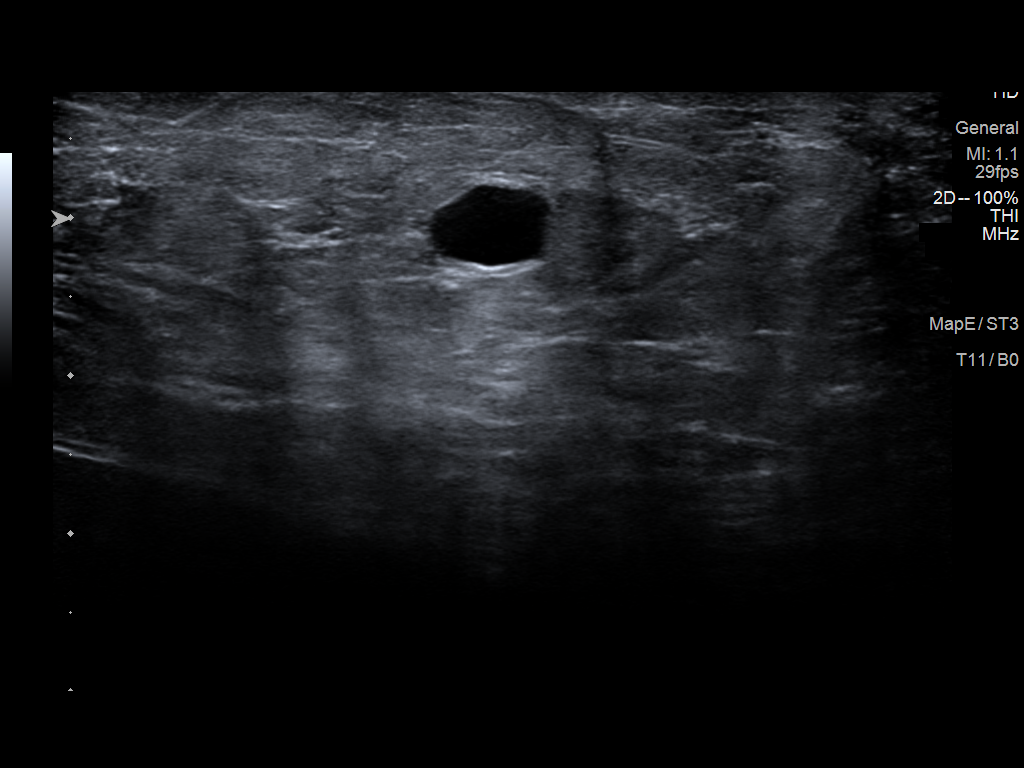
[im 2/5]
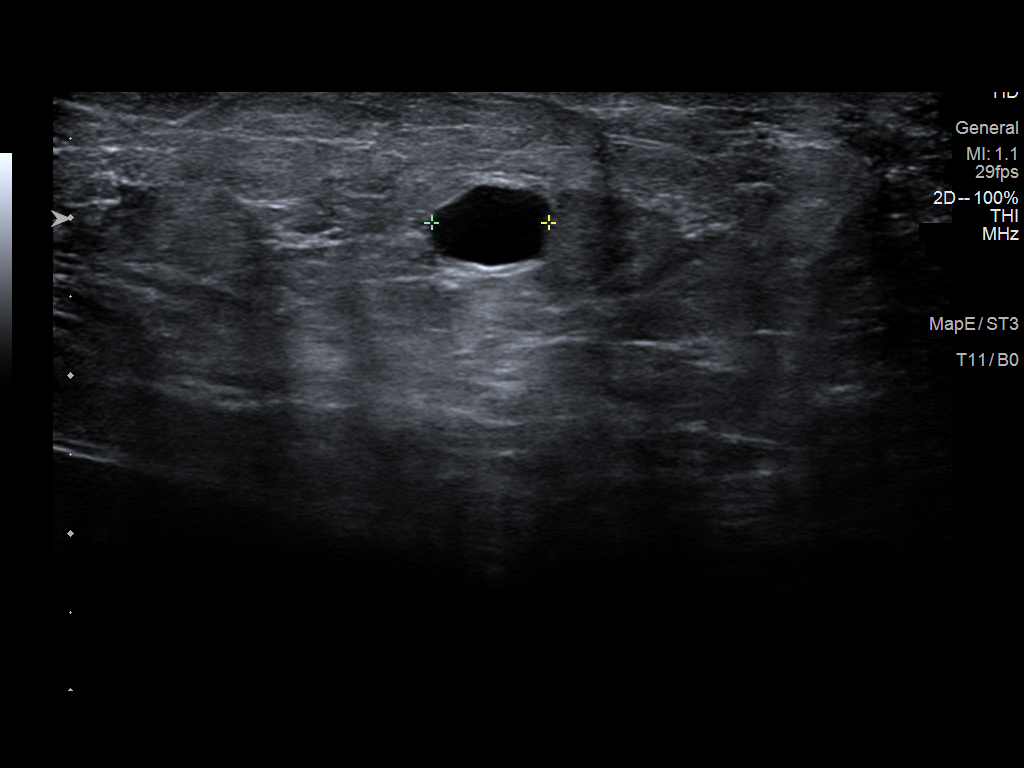
[im 3/5]
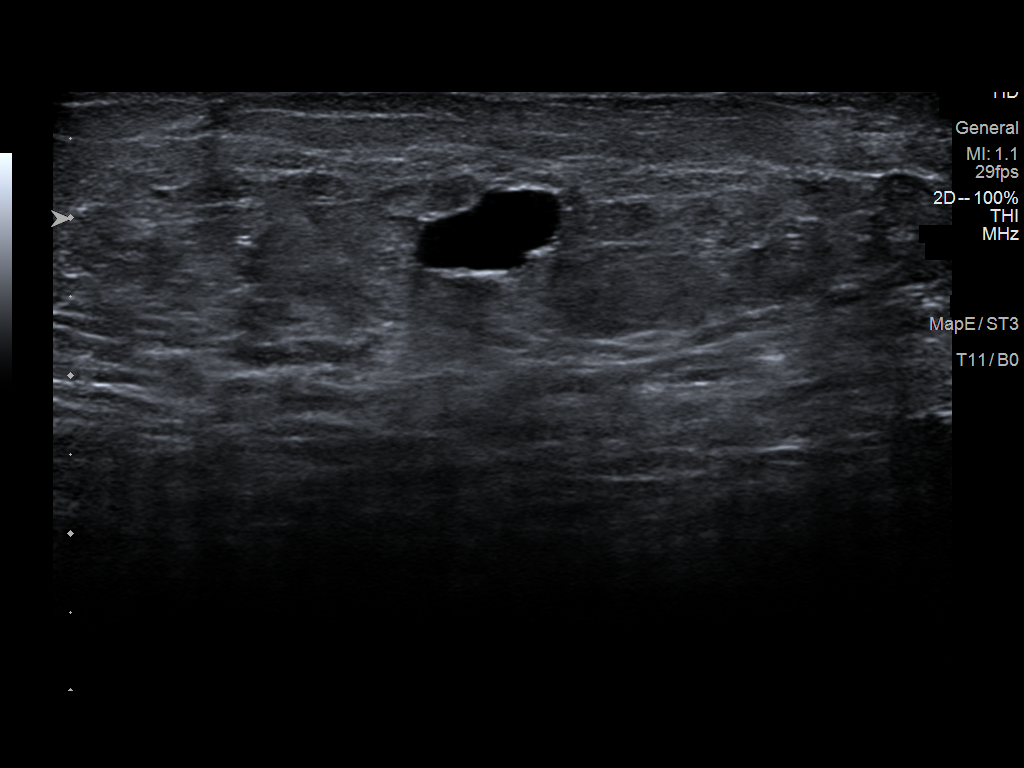
[im 4/5]
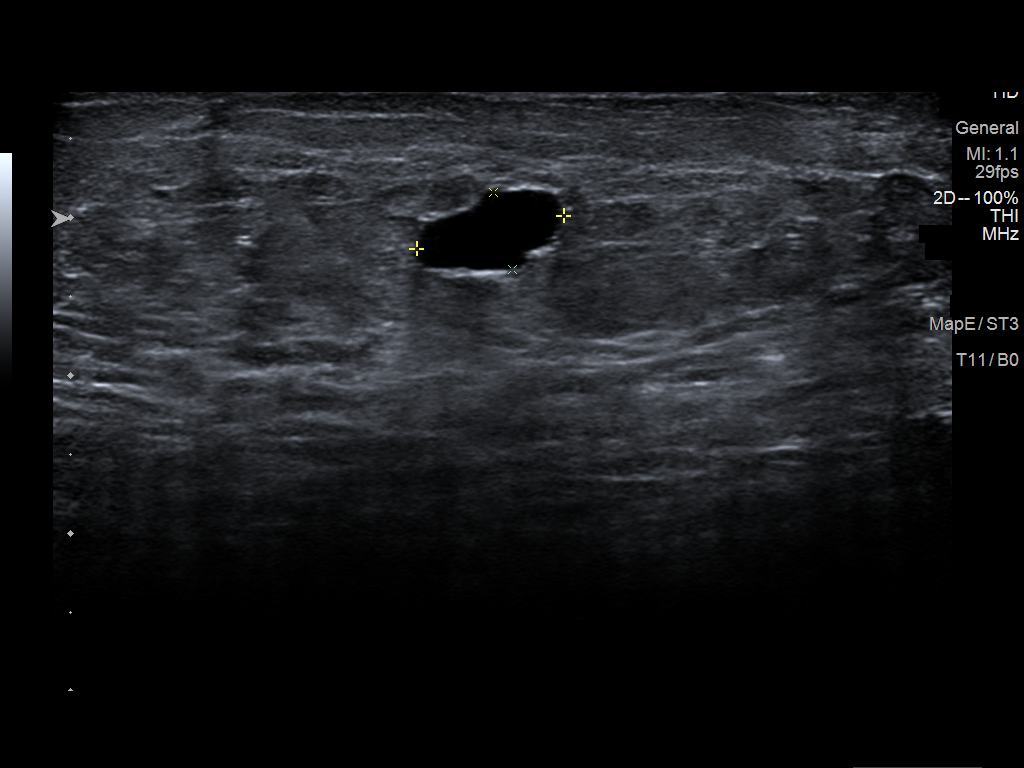
[im 5/5]
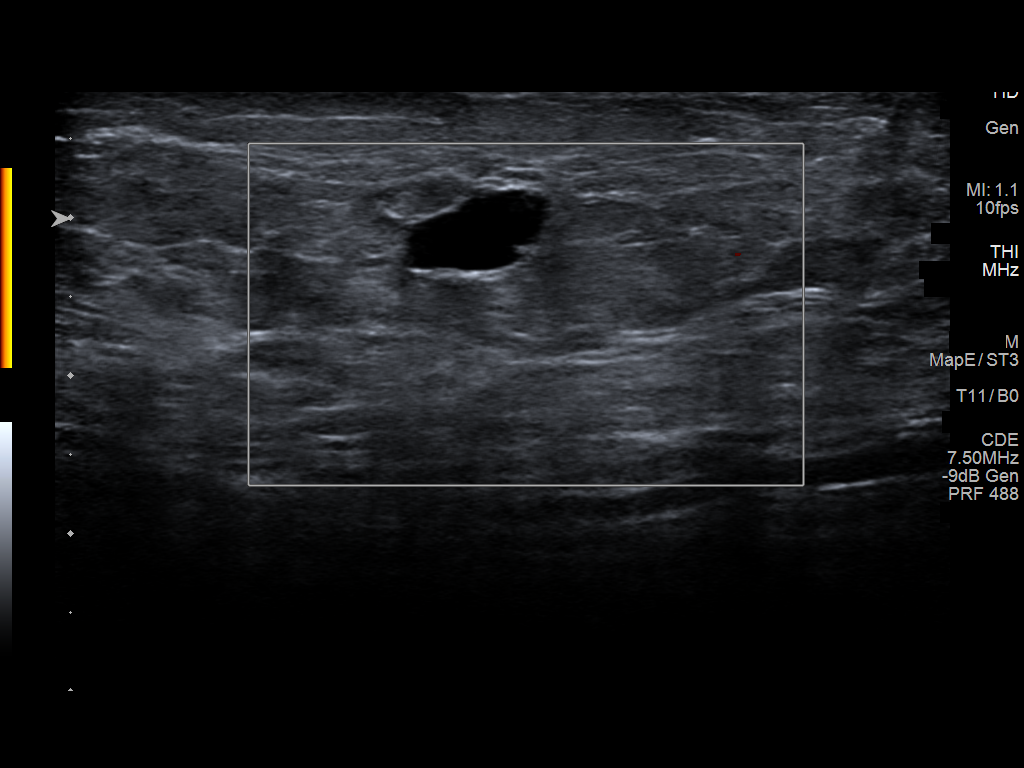

[5 of 5 positions shown; findings below may reference images not displayed]

FINDINGS: Targeted ultrasound is performed, showing an oval, circumscribed
anechoic mass with posterior acoustic enhancement at the 8 o'clock
position 1 cm from the nipple. It measures 10 x 5 x 7 mm. There is
no internal vascularity. This correlates well with the mammographic
finding.
IMPRESSION: Benign left breast simple cyst corresponding with the screening
mammographic findings. No further follow-up required.

RECOMMENDATION:
Screening mammogram in one year.(Code:BA-Z-CGT)

I have discussed the findings and recommendations with the patient.
If applicable, a reminder letter will be sent to the patient
regarding the next appointment.

BI-RADS CATEGORY  2: Benign.

## 2023-10-02 DIAGNOSIS — E669 Obesity, unspecified: Secondary | ICD-10-CM | POA: Diagnosis not present

## 2023-10-02 DIAGNOSIS — J4530 Mild persistent asthma, uncomplicated: Secondary | ICD-10-CM | POA: Diagnosis not present

## 2023-10-02 DIAGNOSIS — J4521 Mild intermittent asthma with (acute) exacerbation: Secondary | ICD-10-CM | POA: Diagnosis not present

## 2023-10-02 DIAGNOSIS — E7800 Pure hypercholesterolemia, unspecified: Secondary | ICD-10-CM | POA: Diagnosis not present

## 2023-10-09 DIAGNOSIS — Z0000 Encounter for general adult medical examination without abnormal findings: Secondary | ICD-10-CM | POA: Diagnosis not present

## 2023-10-09 DIAGNOSIS — E7800 Pure hypercholesterolemia, unspecified: Secondary | ICD-10-CM | POA: Diagnosis not present

## 2023-10-09 DIAGNOSIS — Z6833 Body mass index (BMI) 33.0-33.9, adult: Secondary | ICD-10-CM | POA: Diagnosis not present

## 2023-10-09 DIAGNOSIS — I129 Hypertensive chronic kidney disease with stage 1 through stage 4 chronic kidney disease, or unspecified chronic kidney disease: Secondary | ICD-10-CM | POA: Diagnosis not present

## 2023-10-09 DIAGNOSIS — N1832 Chronic kidney disease, stage 3b: Secondary | ICD-10-CM | POA: Diagnosis not present

## 2023-10-09 DIAGNOSIS — F03C0 Unspecified dementia, severe, without behavioral disturbance, psychotic disturbance, mood disturbance, and anxiety: Secondary | ICD-10-CM | POA: Diagnosis not present

## 2023-10-09 DIAGNOSIS — Z1331 Encounter for screening for depression: Secondary | ICD-10-CM | POA: Diagnosis not present

## 2023-10-10 ENCOUNTER — Telehealth: Payer: Self-pay

## 2023-10-10 DIAGNOSIS — F03C0 Unspecified dementia, severe, without behavioral disturbance, psychotic disturbance, mood disturbance, and anxiety: Secondary | ICD-10-CM

## 2023-11-01 ENCOUNTER — Telehealth: Payer: Self-pay | Admitting: *Deleted

## 2023-11-01 NOTE — Progress Notes (Unsigned)
 Complex Care Management Note Care Guide Note  11/01/2023 Name: Lindsay Bowen MRN: [id number] DOB: Jul 10, 1950   Complex Care Management Outreach Attempts: An unsuccessful telephone outreach was attempted today to offer the patient information about available complex care management services.  Follow Up Plan:  Additional outreach attempts will be made to offer the patient complex care management information and services.   Encounter Outcome:  No Answer  [name]  [facility]  [facility], [facility] Guide  Direct Dial: [phone]  Fax [phone]

## 2023-11-02 DIAGNOSIS — E669 Obesity, unspecified: Secondary | ICD-10-CM | POA: Diagnosis not present

## 2023-11-02 DIAGNOSIS — E7800 Pure hypercholesterolemia, unspecified: Secondary | ICD-10-CM | POA: Diagnosis not present

## 2023-11-02 DIAGNOSIS — J4521 Mild intermittent asthma with (acute) exacerbation: Secondary | ICD-10-CM | POA: Diagnosis not present

## 2023-11-02 DIAGNOSIS — J4530 Mild persistent asthma, uncomplicated: Secondary | ICD-10-CM | POA: Diagnosis not present

## 2023-11-02 NOTE — Progress Notes (Unsigned)
 Complex Care Management Note Care Guide Note  11/02/2023 Name: Lindsay Bowen MRN: [id number] DOB: 05/29/1950   Complex Care Management Outreach Attempts: A second unsuccessful outreach was attempted today to offer the patient with information about available complex care management services.  Follow Up Plan:  Additional outreach attempts will be made to offer the patient complex care management information and services.   Encounter Outcome:  No Answer  [name]  [facility]  [facility], [facility] Guide  Direct Dial: [phone]  Fax [phone]

## 2023-11-03 NOTE — Progress Notes (Signed)
 Complex Care Management Note  Care Guide Note 11/03/2023 Name: ROSAURA BOLON MRN: [id number] DOB: September 17, 1950  ARWA YERO is a 73 y.o. year old female who sees Pcp, No for primary care. I reached out to Nicolette LITTIE Piety by phone today to offer complex care management services.  Ms. Belizaire was given information about Complex Care Management services today including:   The Complex Care Management services include support from the care team which includes your Nurse Care Manager, Clinical Social Worker, or Pharmacist.  The Complex Care Management team is here to help remove barriers to the health concerns and goals most important to you. Complex Care Management services are voluntary, and the patient may decline or stop services at any time by request to their care team member.   Complex Care Management Consent Status: Patient agreed to services and verbal consent obtained.   Follow up plan:  Telephone appointment with complex care management team member scheduled for:  8/11  Encounter Outcome:  Patient Scheduled  [name]  [facility]  [facility], [facility] Guide  Direct Dial: [phone]  Fax [phone]

## 2023-11-13 ENCOUNTER — Other Ambulatory Visit: Payer: Self-pay | Admitting: Licensed Clinical Social Worker

## 2023-11-14 NOTE — Patient Outreach (Signed)
 CSW briefly spoke with patient's son regarding referral for CCM and to offer services. Son requested CSW call back another day and CSW agreed. CSW will call back on 11/21/2023.  Alm Armor, LCSW [place], [facility] Licensed Clinical Social Worker Care Coordinator [phone]

## 2023-11-21 ENCOUNTER — Other Ambulatory Visit: Payer: Self-pay | Admitting: Licensed Clinical Social Worker

## 2023-11-21 NOTE — Patient Outreach (Signed)
 Complex Care Management   Visit Note  11/21/2023  Name:  Lindsay Bowen MRN: [id number] DOB: Jan 05, 1951  Situation: Referral received for Complex Care Management related to Dementia I obtained verbal consent from Caregiver.  Visit completed with Caregiver  on the phone  Background:   Past Medical History:  Diagnosis Date   Asthma     Assessment: Patient Reported Symptoms:  Cognitive Cognitive Status: Alert and oriented to person, place, and time, Struggling with memory recall, Requires Assistance Decision Making Cognitive/Intellectual Conditions Management [RPT]: None reported or documented in medical history or problem list   Health Maintenance Behaviors: Annual physical exam, Stress management Healing Pattern: Unsure  Neurological Neurological Review of Symptoms: No symptoms reported Neurological Management Strategies: Routine screening Neurological Self-Management Outcome: 3 (uncertain)  HEENT HEENT Symptoms Reported: No symptoms reported      Cardiovascular Cardiovascular Symptoms Reported: No symptoms reported Cardiovascular Management Strategies: Medication therapy, Routine screening Cardiovascular Self-Management Outcome: 3 (uncertain) Cardiovascular Comment: Being Followed by a Provider  Respiratory Respiratory Symptoms Reported: Wheezing Other Respiratory Symptoms: Intermittent Asthma - Uncomplicated - Follwed by PCP Respiratory Management Strategies: Routine screening Respiratory Self-Management Outcome: 4 (good)  Endocrine Endocrine Symptoms Reported: No symptoms reported    Gastrointestinal Gastrointestinal Symptoms Reported: No symptoms reported      Genitourinary Genitourinary Symptoms Reported: No symptoms reported    Integumentary Integumentary Symptoms Reported: No symptoms reported    Musculoskeletal Musculoskelatal Symptoms Reviewed: Unsteady gait, Limited mobility Musculoskeletal Management Strategies: Activity, Medical device, Routine  screening Musculoskeletal Self-Management Outcome: 3 (uncertain)      Psychosocial Psychosocial Symptoms Reported: No symptoms reported     Quality of Family Relationships: helpful, involved, supportive Do you feel physically threatened by others?: No      There were no vitals filed for this visit.  Medications Reviewed Today     Reviewed by Kit Alm LABOR, LCSW (Social Worker) on 11/21/23 at 1023  Med List Status: <None>   Medication Order Taking? Sig Documenting Provider Last Dose Status Informant  albuterol  (VENTOLIN  HFA) 108 (90 Base) MCG/ACT inhaler [id number]  Inhale 2 puffs into the lungs every 4 (four) hours as needed for wheezing or shortness of breath. Haze Lonni PARAS, MD  Active   amLODipine  (NORVASC ) 10 MG tablet [id number] Yes Take 10 mg by mouth daily. [provider]  Active Self, Pharmacy Records  atorvastatin  (LIPITOR) 40 MG tablet [id number] Yes Take 40 mg by mouth daily. [provider]  Active Self, Pharmacy Records  escitalopram  (LEXAPRO ) 10 MG tablet [id number]  Take 1 tablet (10 mg total) by mouth daily. Will Almarie MATSU, MD  Active Self, Pharmacy Records           Med Note ARNELL, [facility]   Thu Nov 24, 2022  1:01 AM) Pt stated she thinks she takes this but is not sure. Unable to find dispense report  FARXIGA 5 MG TABS tablet [id number]  Take 1 tablet by mouth daily. [provider]  Active Self, Pharmacy Records  Fluticasone -Salmeterol (ADVAIR DISKUS) 100-50 MCG/DOSE AEPB [id number]  Inhale 1 puff into the lungs 2 (two) times daily. Need office visit for additional refills. Juliane Che, PA  Active Self, Pharmacy Records  hydrochlorothiazide (HYDRODIURIL) 25 MG tablet [id number] Yes Take 1 tablet by mouth every morning. [provider]  Active Self, Pharmacy Records  losartan (COZAAR) 50 MG tablet [id number] Yes Take 50 mg by mouth daily. [provider]  Active Self, Pharmacy Records  potassium chloride  SA  (KLOR-CON  M) 20 MEQ tablet  [id number]  Take 1 tablet (20 mEq total) by mouth daily for 3 days. Sponseller, Rebekah R, PA-C  Expired 01/29/23 2359             Recommendation:   Continue Current Plan of Care  Follow Up Plan:   Patient has met all care management goals. Care Management case will be closed. Patient has been provided contact information should new needs arise.   Alm Armor, LCSW [place], [facility] Licensed Clinical Social Worker Care Coordinator [phone]

## 2023-11-21 NOTE — Patient Instructions (Signed)
 Visit Information  Thank you for taking time to visit with me today. Please don't hesitate to contact me if I can be of assistance to you before our next scheduled appointment.  Please call the care guide team at [phone] if you need to cancel or reschedule your appointment.   Following is a copy of your care plan:   Goals Addressed             This Visit's Progress    [facility] Social Work Care Plan       Problems:   Care Coordination needs related to Dementia: Alzheimer's dementia  CSW Clinical Goal(s):   Over the next 4 weeks the Caregiver will will follow up with adult day programs as directed by Social Work.  Interventions:  Dementia Care:   Current level of care: home, alone Evaluation of patient safety in current living environment and review of Dementia resources and support (Multiple family members visit daily) ADL's Assessed needs, level of care concerns, how currently meeting needs and barriers to care Discussed private pay options for personal care needs (List of options will be provided) Problem Solving/Task Center strategies reviewed Provided list of adult day programs  Advance Directive A voluntary discussion about advanced care planning including importance of advanced directives, healthcare proxy and living will was discussed. Solution focused intervention provided Active listening / Reflection utilized Solution-Focued Strategies employed:  Patient Goals/Self-Care Activities:  Review information on adult day programs  Plan:   No further follow up required: Caregiver encouraged to reach out with any new needs assessed.        Please call the Suicide and Crisis Lifeline: 988 if you are experiencing a Mental Health or Behavioral Health Crisis or need someone to talk to.  Patient verbalizes understanding of instructions and care plan provided today and agrees to view in MyChart. Active MyChart status and patient understanding of how to access  instructions and care plan via MyChart confirmed with patient.     Alm Armor, LCSW [place], [facility] Licensed Clinical Social Worker Care Coordinator [phone]

## 2023-12-03 DIAGNOSIS — E7800 Pure hypercholesterolemia, unspecified: Secondary | ICD-10-CM | POA: Diagnosis not present

## 2023-12-03 DIAGNOSIS — J4521 Mild intermittent asthma with (acute) exacerbation: Secondary | ICD-10-CM | POA: Diagnosis not present

## 2023-12-03 DIAGNOSIS — E669 Obesity, unspecified: Secondary | ICD-10-CM | POA: Diagnosis not present

## 2023-12-03 DIAGNOSIS — J4530 Mild persistent asthma, uncomplicated: Secondary | ICD-10-CM | POA: Diagnosis not present

## 2024-01-01 DIAGNOSIS — Z6833 Body mass index (BMI) 33.0-33.9, adult: Secondary | ICD-10-CM | POA: Diagnosis not present

## 2024-01-01 DIAGNOSIS — N1832 Chronic kidney disease, stage 3b: Secondary | ICD-10-CM | POA: Diagnosis not present

## 2024-01-01 DIAGNOSIS — F03C0 Unspecified dementia, severe, without behavioral disturbance, psychotic disturbance, mood disturbance, and anxiety: Secondary | ICD-10-CM | POA: Diagnosis not present

## 2024-01-01 DIAGNOSIS — I129 Hypertensive chronic kidney disease with stage 1 through stage 4 chronic kidney disease, or unspecified chronic kidney disease: Secondary | ICD-10-CM | POA: Diagnosis not present

## 2024-01-01 DIAGNOSIS — E669 Obesity, unspecified: Secondary | ICD-10-CM | POA: Diagnosis not present

## 2024-01-01 DIAGNOSIS — Z008 Encounter for other general examination: Secondary | ICD-10-CM | POA: Diagnosis not present

## 2024-01-02 DIAGNOSIS — J4530 Mild persistent asthma, uncomplicated: Secondary | ICD-10-CM | POA: Diagnosis not present

## 2024-01-02 DIAGNOSIS — E669 Obesity, unspecified: Secondary | ICD-10-CM | POA: Diagnosis not present

## 2024-01-02 DIAGNOSIS — E7800 Pure hypercholesterolemia, unspecified: Secondary | ICD-10-CM | POA: Diagnosis not present

## 2024-01-02 DIAGNOSIS — J4521 Mild intermittent asthma with (acute) exacerbation: Secondary | ICD-10-CM | POA: Diagnosis not present

## 2024-01-03 ENCOUNTER — Encounter: Payer: Self-pay | Admitting: Podiatry

## 2024-01-03 ENCOUNTER — Ambulatory Visit: Admitting: Podiatry

## 2024-01-03 DIAGNOSIS — L84 Corns and callosities: Secondary | ICD-10-CM

## 2024-01-03 DIAGNOSIS — M2041 Other hammer toe(s) (acquired), right foot: Secondary | ICD-10-CM

## 2024-01-03 NOTE — Progress Notes (Signed)
  Subjective:  Patient ID: Lindsay Bowen, female    DOB: 12/02/1950,   MRN: [id number]  Chief Complaint  Patient presents with   Callouses    This place on my little toe hurts a lot. (5th digit right)    Nail Problem    Niece stated, She need her toenails clipped badly.     73 y.o. female presents for concern of spot on her right fifth toe that has been present a while. Relates she has tried different shoes but area is still very painful. Also requesting to have her nails trimmed.  . Denies any other pedal complaints. Denies n/v/f/c.   Past Medical History:  Diagnosis Date   Asthma     Objective:  Physical Exam: Vascular: DP/PT pulses 2/4 bilateral. CFT <3 seconds. Normal hair growth on digits. No edema.  Skin. No lacerations or abrasions bilateral feet. Hyperkeratotic cored lesion noted to dorsal fifth digit on right.  Musculoskeletal: MMT 5/5 bilateral lower extremities in DF, PF, Inversion and Eversion. Deceased ROM in DF of ankle joint. 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  Neurological: Sensation intact to light touch.   Assessment:   1. Hammertoe of right foot   2. Corns and callosities      Plan:  Patient was evaluated and treated and all questions answered. -Hyperkeratotic lesion debrided to right fifth digit with 2cc of 2%lidocaince applied for patient comfort as request. Debrided without incident as courtesy. Hallux nail debrided as courtesy as well.  -Educated on hammertoes and treatment options  -Discussed padding including toe caps and crest pads.  -Discussed need for potential surgery if pain does not improved.  -Patient to follow-up as needed. Discussed calling if any changes or increased pain.    [place] Failing, DPM

## 2024-04-17 ENCOUNTER — Telehealth: Payer: Self-pay

## 2024-04-17 DIAGNOSIS — F03C0 Unspecified dementia, severe, without behavioral disturbance, psychotic disturbance, mood disturbance, and anxiety: Secondary | ICD-10-CM

## 2024-04-25 ENCOUNTER — Telehealth: Payer: Self-pay | Admitting: *Deleted

## 2024-04-25 NOTE — Progress Notes (Signed)
 Complex Care Management Note  Care Guide Note 04/25/2024 Name: Lindsay Bowen MRN: [id number] DOB: 01-02-1951  Lindsay Bowen is a 74 y.o. year old female who sees Cleotilde Planas, MD for primary care. I reached out to Lindsay Bowen Piety son Lindsay Bowen by phone today to offer complex care management services.  Ms. Molchan son Lindsay Bowen was given information about Complex Care Management services today including:   The Complex Care Management services include support from the care team which includes your Nurse Care Manager, Clinical Social Worker, or Pharmacist.  The Complex Care Management team is here to help remove barriers to the health concerns and goals most important to you. Complex Care Management services are voluntary, and the patient may decline or stop services at any time by request to their care team member.   Complex Care Management Consent Status: Patient son Lindsay Bowen agreed to services and verbal consent obtained.   Follow up plan:  Telephone appointment with complex care management team member scheduled for:  05/06/24 with LCSW and 05/02/24 with RNCM   Encounter Outcome:  Patient Scheduled  Lindsay Bowen  [facility]  [facility], [facility] Guide  Direct Dial: [phone]  Fax [phone]

## 2024-05-02 ENCOUNTER — Telehealth: Payer: Self-pay

## 2024-05-02 NOTE — Patient Instructions (Signed)
 Nicolette LITTIE Piety - I am sorry I was unable to reach you today for our scheduled appointment. I work with Cleotilde Planas, MD and am calling to support your healthcare needs. Please contact me at [phone] at your earliest convenience. I look forward to speaking with you soon.   Thank you,  Rosaline Finlay, RN MSN [place]  [facility] RN Care Manager Direct Dial: [phone]  Fax: [phone]

## 2024-05-06 ENCOUNTER — Other Ambulatory Visit: Payer: Self-pay | Admitting: Licensed Clinical Social Worker

## 2024-05-06 NOTE — Patient Instructions (Signed)
 Visit Information  Thank you for taking time to visit with me today. Please don't hesitate to contact me if I can be of assistance to you before our next scheduled appointment.  Our next appointment is by telephone on 06/03/24 at 9:30am. Please call the care guide team at [phone] if you need to cancel or reschedule your appointment.   Following is a copy of your care plan:   Goals Addressed   None     Please call 911 if you are experiencing a Mental Health or Behavioral Health Crisis or need someone to talk to.  Patient verbalized understanding of Care plan and visit instructions communicated this visit  Cena Ligas, LCSW Clinical Social Worker [facility] [employer]

## 2024-05-09 ENCOUNTER — Ambulatory Visit: Admitting: Neurology

## 2024-06-03 ENCOUNTER — Telehealth: Admitting: Licensed Clinical Social Worker
